# Patient Record
Sex: Female | Born: 1965 | Smoking: Former smoker
Health system: Southern US, Community
[De-identification: ages and names within clinical notes are randomized; demographics above are authoritative.]

## PROBLEM LIST (undated history)

## (undated) DIAGNOSIS — D649 Anemia, unspecified: Secondary | ICD-10-CM

## (undated) DIAGNOSIS — R569 Unspecified convulsions: Secondary | ICD-10-CM

## (undated) DIAGNOSIS — K219 Gastro-esophageal reflux disease without esophagitis: Secondary | ICD-10-CM

## (undated) HISTORY — PX: MYOMECTOMY: SHX85

---

## 2015-05-03 DIAGNOSIS — R87610 Atypical squamous cells of undetermined significance on cytologic smear of cervix (ASC-US): Secondary | ICD-10-CM | POA: Insufficient documentation

## 2015-05-16 ENCOUNTER — Other Ambulatory Visit (HOSPITAL_COMMUNITY): Payer: Self-pay | Admitting: *Deleted

## 2015-05-16 DIAGNOSIS — N6320 Unspecified lump in the left breast, unspecified quadrant: Secondary | ICD-10-CM

## 2015-05-25 ENCOUNTER — Ambulatory Visit (HOSPITAL_COMMUNITY)
Admission: RE | Admit: 2015-05-25 | Discharge: 2015-05-25 | Disposition: A | Discharge status: Home / Self Care | Payer: No Typology Code available for payment source | Source: Ambulatory Visit | Attending: Obstetrics and Gynecology | Admitting: Obstetrics and Gynecology

## 2015-05-25 ENCOUNTER — Ambulatory Visit
Admission: RE | Admit: 2015-05-25 | Discharge: 2015-05-25 | Disposition: A | Discharge status: Home / Self Care | Payer: No Typology Code available for payment source | Source: Ambulatory Visit | Attending: Obstetrics and Gynecology | Admitting: Obstetrics and Gynecology

## 2015-05-25 ENCOUNTER — Encounter (HOSPITAL_COMMUNITY): Payer: Self-pay

## 2015-05-25 VITALS — BP 108/62 | Temp 98.1°F | Ht 60.0 in | Wt 117.0 lb

## 2015-05-25 DIAGNOSIS — N644 Mastodynia: Secondary | ICD-10-CM

## 2015-05-25 DIAGNOSIS — N6320 Unspecified lump in the left breast, unspecified quadrant: Secondary | ICD-10-CM

## 2015-05-25 DIAGNOSIS — Z1239 Encounter for other screening for malignant neoplasm of breast: Secondary | ICD-10-CM

## 2015-05-25 NOTE — Progress Notes (Signed)
CLINIC:  [facility] transport [facility]) Clinic  REASON FOR VISIT: Well-woman exam and diagnostic mammogram.    HISTORY OF PRESENT ILLNESS:  Ms. Costello is a 49 y.o. female who presents to the [facility] today for clinical breast exam. She has questionable history of breast cancer in her mother, but she is unsure.  She reports left breast pain and fullness/"heaviness", that is worse when lying down.  She reports palpating a lump at about 9:30 position in left breast, near nipple. Her last mammogram was done at [facility] in 2011 per her report.  Her last pap smear was in 04/2015 and was negative.  She has no history of abnormal pap smears.   REVIEW OF SYSTEMS:  Denies any right breast pain, nodularity, nipple inversion, or nipple discharge bilaterally.  Left breast per HPI.   ALLERGIES: Allergies  Allergen Reactions  . Penicillins     CURRENT MEDICATIONS:  No current outpatient prescriptions on file prior to encounter.   No current facility-administered medications on file prior to encounter.     PHYSICAL EXAM:  Vitals:  Filed Vitals:   05/25/15 1043  BP: 108/62  Temp: 98.1 F (36.7 C)   General: Well-nourished, well-appearing female in no acute distress.  She is unaccompanied in clinic today.  Rolena Infante, LPN was present during physical exam for this patient.  Breasts: Bilateral breasts exposed and observed with patient standing (arms at side, arms on hips, arms on hips flexed forward, and arms over head).  No gross abnormalities including breast skin puckering or dimpling noted on observation.  Breasts symmetrical without evidence of skin redness, thickening, or peau d'orange appearance. No nipple retraction or nipple discharge noted bilaterally.  No breast nodularity palpated in bilateral breasts.  In area of patient concern (left breast, 9 o'clock, about 2 cm from nipple), there is mild tenderness, but no palpable discrete masses or nodularities.  Normal  fibrocystic breast changes noted in bilateral breasts.   Axillary lymph nodes: No axillary lymphadenopathy bilaterally.   GU: Exam deferred. Pap smear is up-to-date.  ASSESSMENT & PLAN:   1. Breast cancer screening: Ms. Keir has no palpable breast abnormalities on her clinical breast exam today.  She will receive her diagnostic mammogram as scheduled.  She will be contacted by the imaging center for results of the mammogram.  She was given instructions and educational materials regarding breast self-awareness. Ms. Brys is aware of this plan and agrees with it.    Ms. Tally was encouraged to ask questions and all questions were answered to her satisfaction.    Mike Craze, NP [place]  [phone]

## 2015-06-01 ENCOUNTER — Other Ambulatory Visit (HOSPITAL_COMMUNITY): Payer: Self-pay | Admitting: Obstetrics and Gynecology

## 2015-06-01 DIAGNOSIS — N6310 Unspecified lump in the right breast, unspecified quadrant: Secondary | ICD-10-CM

## 2015-06-05 ENCOUNTER — Ambulatory Visit
Admission: RE | Admit: 2015-06-05 | Discharge: 2015-06-05 | Disposition: A | Discharge status: Home / Self Care | Payer: No Typology Code available for payment source | Source: Ambulatory Visit | Attending: Obstetrics and Gynecology | Admitting: Obstetrics and Gynecology

## 2015-06-05 DIAGNOSIS — N6310 Unspecified lump in the right breast, unspecified quadrant: Secondary | ICD-10-CM

## 2015-06-08 ENCOUNTER — Encounter: Payer: Self-pay | Admitting: Medical

## 2015-06-13 ENCOUNTER — Encounter (HOSPITAL_COMMUNITY): Payer: Self-pay | Admitting: *Deleted

## 2015-06-21 ENCOUNTER — Encounter: Payer: Self-pay | Admitting: Obstetrics & Gynecology

## 2015-06-21 ENCOUNTER — Ambulatory Visit: Payer: Self-pay | Admitting: Obstetrics & Gynecology

## 2015-06-21 DIAGNOSIS — N939 Abnormal uterine and vaginal bleeding, unspecified: Secondary | ICD-10-CM

## 2015-06-21 DIAGNOSIS — D219 Benign neoplasm of connective and other soft tissue, unspecified: Secondary | ICD-10-CM | POA: Insufficient documentation

## 2015-06-21 DIAGNOSIS — D259 Leiomyoma of uterus, unspecified: Secondary | ICD-10-CM

## 2015-06-21 DIAGNOSIS — R102 Pelvic and perineal pain: Secondary | ICD-10-CM

## 2015-06-21 DIAGNOSIS — R87610 Atypical squamous cells of undetermined significance on cytologic smear of cervix (ASC-US): Secondary | ICD-10-CM

## 2015-06-21 NOTE — Progress Notes (Signed)
[place] scheduled for October 19th @

## 2015-06-21 NOTE — Progress Notes (Signed)
Patient had ASCUS pap, negative HRHPV on 05/03/15 -> colposcopy not indicated. Ultrasound scheduled for history of chronic pelvic pain, irregular menses, fibroids.  She will follow up to discuss results.  Verita Schneiders, MD, [place] Attending Obstetrician & Gynecologist, [place] for [facility]

## 2015-06-21 NOTE — Patient Instructions (Signed)
Return to clinic for any scheduled appointments or for any gynecologic concerns as needed.   

## 2015-06-28 ENCOUNTER — Ambulatory Visit (INDEPENDENT_AMBULATORY_CARE_PROVIDER_SITE_OTHER): Payer: Self-pay | Admitting: Obstetrics & Gynecology

## 2015-06-28 ENCOUNTER — Encounter: Payer: Self-pay | Admitting: Obstetrics & Gynecology

## 2015-06-28 ENCOUNTER — Ambulatory Visit (HOSPITAL_COMMUNITY)
Admission: RE | Admit: 2015-06-28 | Discharge: 2015-06-28 | Disposition: A | Discharge status: Home / Self Care | Payer: Self-pay | Source: Ambulatory Visit | Attending: Obstetrics & Gynecology | Admitting: Obstetrics & Gynecology

## 2015-06-28 VITALS — BP 133/61 | HR 57 | Resp 16 | Ht 60.0 in | Wt 116.0 lb

## 2015-06-28 DIAGNOSIS — N852 Hypertrophy of uterus: Secondary | ICD-10-CM | POA: Insufficient documentation

## 2015-06-28 DIAGNOSIS — D259 Leiomyoma of uterus, unspecified: Secondary | ICD-10-CM

## 2015-06-28 DIAGNOSIS — N83201 Unspecified ovarian cyst, right side: Secondary | ICD-10-CM | POA: Insufficient documentation

## 2015-06-28 DIAGNOSIS — D252 Subserosal leiomyoma of uterus: Secondary | ICD-10-CM | POA: Insufficient documentation

## 2015-06-28 DIAGNOSIS — N939 Abnormal uterine and vaginal bleeding, unspecified: Secondary | ICD-10-CM

## 2015-06-28 DIAGNOSIS — R102 Pelvic and perineal pain: Secondary | ICD-10-CM | POA: Insufficient documentation

## 2015-06-28 DIAGNOSIS — G8929 Other chronic pain: Secondary | ICD-10-CM | POA: Insufficient documentation

## 2015-06-28 DIAGNOSIS — N926 Irregular menstruation, unspecified: Secondary | ICD-10-CM | POA: Insufficient documentation

## 2015-06-28 MED ORDER — FERROUS SULFATE 325 (65 FE) MG PO TABS: 325.0000 mg | ORAL_TABLET | Freq: Every day | ORAL | Status: DC

## 2015-06-28 MED ORDER — FERROUS SULFATE 325 (65 FE) MG PO TABS: 325.0000 mg | ORAL_TABLET | Freq: Two times a day (BID) | ORAL | Status: DC

## 2015-06-28 MED ORDER — LEUPROLIDE ACETATE (3 MONTH) 11.25 MG IM KIT: 11.2500 mg | PACK | Freq: Once | INTRAMUSCULAR | Status: DC

## 2015-06-28 MED ORDER — MEDROXYPROGESTERONE ACETATE 10 MG PO TABS: 20.0000 mg | ORAL_TABLET | Freq: Every day | ORAL | Status: DC

## 2015-06-28 NOTE — Progress Notes (Signed)
Patient ID: Ellen Hinton, female   DOB: [birth year], 49 y.o.   MRN: [id number]  Chief Complaint  Patient presents with  . Pelvic Pain  DUB  HPI Ellen Hinton is a 49 y.o. female.  [id number] Patient's last menstrual period was 05/16/2015.  US shows multiple fibroids and patient has DUB and cramps, H/O myomectomy>20 years ago HPI  No past medical history on file.  Past Surgical History  Procedure Laterality Date  . Myomectomy      Family History  Problem Relation Age of Onset  . Cancer Mother     lung  . Hypertension Father   . Stroke Father   . Diabetes Maternal Grandmother     Social History Social History  Substance Use Topics  . Smoking status: Current Every Day Smoker    Types: E-cigarettes  . Smokeless tobacco: Never Used  . Alcohol Use: No    Allergies  Allergen Reactions  . Penicillins     Current Outpatient Prescriptions  Medication Sig Dispense Refill  . medroxyPROGESTERone (PROVERA) 10 MG tablet Take 2 tablets (20 mg total) by mouth daily. 30 tablet 2   Current Facility-Administered Medications  Medication Dose Route Frequency Provider Last Rate Last Dose  . ferrous sulfate tablet 325 mg  325 mg Oral BID WC Woodroe Mode, MD      . leuprolide (LUPRON) injection 11.25 mg  11.25 mg Intramuscular Once Woodroe Mode, MD        Review of Systems Review of Systems  Constitutional: Positive for fatigue.  Gastrointestinal: Positive for abdominal pain.  Genitourinary: Positive for vaginal bleeding, menstrual problem and pelvic pain.    Blood pressure 133/61, pulse 57, resp. rate 16, height 5' (1.524 m), weight 116 lb (52.617 kg), last menstrual period 05/16/2015.  Physical Exam Physical Exam  Constitutional: She appears well-developed. No distress.  Skin: Skin is warm and dry. No pallor.  Psychiatric: She has a normal mood and affect. Her behavior is normal.    Data Reviewed   CLINICAL DATA: Chronic pelvic pain, irregular cycles,  fibroids  EXAM: TRANSABDOMINAL AND TRANSVAGINAL ULTRASOUND OF PELVIS  TECHNIQUE: Both transabdominal and transvaginal ultrasound examinations of the pelvis were performed. Transabdominal technique was performed for global imaging of the pelvis including uterus, ovaries, adnexal regions, and pelvic cul-de-sac. It was necessary to proceed with endovaginal exam following the transabdominal exam to visualize the endometrium.  COMPARISON: None  FINDINGS: Uterus  Measurements: 14.3 x 7.7 x 9.9 cm. Numerous uterine fibroids, including:  --4.5 x 4.2 x 4.0 cm subserosal fibroid in the left uterine fundus  --5.0 x 4.0 x 4.6 cm degenerating subserosal/pedunculated fibroid fibroid in the left uterine fundus  --5.9 x 5.5 x 5.7 cm subserosal fibroid in the left uterine body  --4.8 x 5.2 x 6.3 cm subserosal fibroid in the right uterine fundus  Endometrium  Thickness: 5 mm. Poorly visualized/ distorted by multiple uterine fibroids.  Right ovary  Measurements: 6.5 x 2.7 x 3.9 cm. Multiple simple cysts/follicles, measuring up to 4.2 x 2.0 x 2.4 cm.  Left ovary  Not discretely visualized.  Other findings  No free fluid.  IMPRESSION: Enlarged uterus with numerous uterine fibroids measuring up to 6.3 cm, as above.  Simple right ovarian cyst/follicles measuring up to 4.2 cm, likely physiologic.   Electronically Signed  By: Julian Hy M.D.  On: 06/28/2015 14:30       Assessment    Fibroid uterus and DUB and dysmenorrhea     Plan    Provera  20 mg daily Apply for Lupron Depot 11.25 RTC for EMBx Anticipate schedule TAH        ARNOLD,JAMES 06/28/2015, 4:12 PM

## 2015-06-28 NOTE — Progress Notes (Signed)
Patient ID: Ellen Hinton, female   DOB: 06/27/1966, 49 y.o.   MRN: [id number] Pt here for pelvic pain and menorrhagia. Vaginal bleeding over a month. Pain scale today 8/10. Pt c/o fatigue and thinks she could be anemic. She explains her diet is very nutritional and the "tired" feeling she feels is coming from excessive bleeding.  Pt had [place] today and would like results if possible.

## 2015-06-28 NOTE — Patient Instructions (Signed)
Abdominal Hysterectomy Abdominal hysterectomy is a surgical procedure to remove your womb (uterus). Your uterus is the muscular organ that contains a developing baby. This surgery is done for many reasons. You may need an abdominal hysterectomy if you have cancer, growths (tumors), long-term pain, or bleeding. You may also have this procedure if your uterus has slipped down into your vagina (uterine prolapse). Depending on why you need an abdominal hysterectomy, you may also have other reproductive organs removed. These could include the part of your vagina that connects with your uterus (cervix), the organs that make eggs (ovaries), and the tubes that connect the ovaries to the uterus (fallopian tubes). LET [facility] CARE PROVIDER KNOW ABOUT:   Any allergies you have.  All medicines you are taking, including vitamins, herbs, eye drops, creams, and over-the-counter medicines.  Previous problems you or members of your family have had with the use of anesthetics.  Any blood disorders you have.  Previous surgeries you have had.  Medical conditions you have. RISKS AND COMPLICATIONS Generally, this is a safe procedure. However, as with any procedure, problems can occur. Infection is the most common problem after an abdominal hysterectomy. Other possible problems include:  Bleeding.  Formation of blood clots that may break free and travel to your lungs.  Injury to other organs near your uterus.  Nerve injury causing nerve pain.  Decreased interest in sex or pain during sexual intercourse. BEFORE THE PROCEDURE  Abdominal hysterectomy is a major surgical procedure. It can affect the way you feel about yourself. Talk to your health care provider about the physical and emotional changes hysterectomy may cause.  You may need to have blood work and X-rays done before surgery.  Quit smoking if you smoke. Ask your health care provider for help if you are struggling to quit.  Stop taking  medicines that thin your blood as directed by your health care provider.  You may be instructed to take antibiotic medicines or laxatives before surgery.  Do not eat or drink anything for 6-8 hours before surgery.  Take your regular medicines with a small sip of water.  Bathe or shower the night or morning before surgery. PROCEDURE  Abdominal hysterectomy is done in the operating room at the hospital.  In most cases, you will be given a medicine that makes you go to sleep (general anesthetic).  The surgeon will make a cut (incision) through the skin in your lower belly.  The incision may be about 5-7 inches long. It may go side-to-side or up-and-down.  The surgeon will move aside the body tissue that covers your uterus. The surgeon will then carefully take out your uterus along with any of your other reproductive organs that need to be removed.  Bleeding will be controlled with clamps or sutures.  The surgeon will close your incision with sutures or metal clips. AFTER THE PROCEDURE  You will have some pain immediately after the procedure.  You will be given pain medicine in the recovery room.  You will be taken to your hospital room when you have recovered from the anesthesia.  You may need to stay in the hospital for 2-5 days.  You will be given instructions for recovery at home.   This information is not intended to replace advice given to you by your health care provider. Make sure you discuss any questions you have with your health care provider.   Document Released: 08/31/2013 Document Reviewed: 08/31/2013 Elsevier Interactive Patient Education [employer].

## 2015-07-04 ENCOUNTER — Encounter: Payer: Self-pay | Admitting: *Deleted

## 2015-07-19 ENCOUNTER — Other Ambulatory Visit (HOSPITAL_COMMUNITY)
Admission: RE | Admit: 2015-07-19 | Discharge: 2015-07-19 | Disposition: A | Discharge status: Home / Self Care | Payer: Self-pay | Source: Ambulatory Visit | Attending: Obstetrics & Gynecology | Admitting: Obstetrics & Gynecology

## 2015-07-19 ENCOUNTER — Ambulatory Visit: Payer: Self-pay | Admitting: Obstetrics & Gynecology

## 2015-07-19 ENCOUNTER — Encounter: Payer: Self-pay | Admitting: Obstetrics & Gynecology

## 2015-07-19 VITALS — BP 147/69 | HR 67 | Temp 97.8°F | Ht 60.0 in | Wt 120.0 lb

## 2015-07-19 DIAGNOSIS — N946 Dysmenorrhea, unspecified: Secondary | ICD-10-CM | POA: Insufficient documentation

## 2015-07-19 DIAGNOSIS — N840 Polyp of corpus uteri: Secondary | ICD-10-CM | POA: Insufficient documentation

## 2015-07-19 DIAGNOSIS — D259 Leiomyoma of uterus, unspecified: Secondary | ICD-10-CM | POA: Insufficient documentation

## 2015-07-19 DIAGNOSIS — N938 Other specified abnormal uterine and vaginal bleeding: Secondary | ICD-10-CM | POA: Insufficient documentation

## 2015-07-19 LAB — POCT PREGNANCY, URINE: PREG TEST UR: NEGATIVE

## 2015-07-19 NOTE — Progress Notes (Signed)
Patient ID: Ellen Hinton, female   DOB: Jul 05, 1966, 49 y.o.   MRN: [id number] Here for endometrial biopsy for evaluation of her fibroid uterus with plan to schedule TAH  Patient's last menstrual period was 05/16/2015.   Patient given informed consent, signed copy in the chart, time out was performed. Appropriate time out taken. . The patient was placed in the lithotomy position and the cervix brought into view with sterile speculum.  Portio of cervix cleansed x 2 with betadine swabs.  A tenaculum was placed in the anterior lip of the cervix.  The uterus was sounded for depth of 9 cm. A pipelle was introduced to into the uterus, suction created,  and an endometrial sample was obtained. All equipment was removed and accounted for.  The patient tolerated the procedure well.    Patient given post procedure instructions. The patient will be called for results. Applied for lupron depot  Woodroe Mode, MD 07/19/2015

## 2015-07-19 NOTE — Patient Instructions (Signed)

## 2015-08-02 ENCOUNTER — Telehealth: Payer: Self-pay

## 2015-08-02 NOTE — Telephone Encounter (Signed)
Per Dr. Roselie Awkward, pt needs to be informed that her endo bx was benign, f/u on lupron application; surgery has not been scheduled.  Called pt and LM to please return call the call and that our office will be closing today @ 5 and not reopening until Monday @ 0800.

## 2015-08-02 NOTE — Telephone Encounter (Signed)
Pt returned call and I informed her of normal endo bx results.  Contacted [employer] concerning pt's Lupron application and was informed that the date beside provider signature was missing.  Once we fill in the date the pt would be approved for the Lupron.  I re-faxed Lupron application with requested correction.  Informed pt that she should receive a letter from [employer] indicating approval and also we call her when we receive the Lupron medication in the office.  Pt stated understanding with no further questions.

## 2015-08-10 ENCOUNTER — Ambulatory Visit (INDEPENDENT_AMBULATORY_CARE_PROVIDER_SITE_OTHER): Payer: Self-pay | Admitting: *Deleted

## 2015-08-10 VITALS — BP 129/82 | HR 61

## 2015-08-10 DIAGNOSIS — N939 Abnormal uterine and vaginal bleeding, unspecified: Secondary | ICD-10-CM

## 2015-08-10 DIAGNOSIS — D259 Leiomyoma of uterus, unspecified: Secondary | ICD-10-CM

## 2015-08-10 MED ORDER — LEUPROLIDE ACETATE (3 MONTH) 11.25 MG IM KIT
11.2500 mg | PACK | Freq: Once | INTRAMUSCULAR | Status: AC
  Administered 2015-08-10: 11.25 mg via INTRAMUSCULAR

## 2015-08-10 NOTE — Patient Instructions (Signed)
Leuprolide depot injection What is this medicine? LEUPROLIDE (loo PROE lide) is a man-made protein that acts like a natural hormone in the body. It decreases testosterone in men and decreases estrogen in women. In men, this medicine is used to treat advanced prostate cancer. In women, some forms of this medicine may be used to treat endometriosis, uterine fibroids, or other female hormone-related problems. This medicine may be used for other purposes; ask your health care provider or pharmacist if you have questions. What should I tell my health care provider before I take this medicine? They need to know if you have any of these conditions: -diabetes -heart disease or previous heart attack -high blood pressure -high cholesterol -osteoporosis -pain or difficulty passing urine -spinal cord metastasis -stroke -tobacco smoker -unusual vaginal bleeding (women) -an unusual or allergic reaction to leuprolide, benzyl alcohol, other medicines, foods, dyes, or preservatives -pregnant or trying to get pregnant -breast-feeding How should I use this medicine? This medicine is for injection into a muscle or for injection under the skin. It is given by a health care professional in a hospital or clinic setting. The specific product will determine how it will be given to you. Make sure you understand which product you receive and how often you will receive it. Talk to your pediatrician regarding the use of this medicine in children. Special care may be needed. Overdosage: If you think you have taken too much of this medicine contact a poison control center or emergency room at once. NOTE: This medicine is only for you. Do not share this medicine with others. What if I miss a dose? It is important not to miss a dose. Call your doctor or health care professional if you are unable to keep an appointment. Depot injections: Depot injections are given either once-monthly, every 12 weeks, every 16 weeks, or  every 24 weeks depending on the product you are prescribed. The product you are prescribed will be based on if you are female or female, and your condition. Make sure you understand your product and dosing. What may interact with this medicine? Do not take this medicine with any of the following medications: -chasteberry This medicine may also interact with the following medications: -herbal or dietary supplements, like black cohosh or DHEA -female hormones, like estrogens or progestins and birth control pills, patches, rings, or injections -female hormones, like testosterone This list may not describe all possible interactions. Give your health care provider a list of all the medicines, herbs, non-prescription drugs, or dietary supplements you use. Also tell them if you smoke, drink alcohol, or use illegal drugs. Some items may interact with your medicine. What should I watch for while using this medicine? Visit your doctor or health care professional for regular checks on your progress. During the first weeks of treatment, your symptoms may get worse, but then will improve as you continue your treatment. You may get hot flashes, increased bone pain, increased difficulty passing urine, or an aggravation of nerve symptoms. Discuss these effects with your doctor or health care professional, some of them may improve with continued use of this medicine. Female patients may experience a menstrual cycle or spotting during the first months of therapy with this medicine. If this continues, contact your doctor or health care professional. What side effects may I notice from receiving this medicine? Side effects that you should report to your doctor or health care professional as soon as possible: -allergic reactions like skin rash, itching or hives, swelling of the   face, lips, or tongue -breathing problems -chest pain -depression or memory disorders -pain in your legs or groin -pain at site where injected or  implanted -severe headache -swelling of the feet and legs -visual changes -vomiting Side effects that usually do not require medical attention (report to your doctor or health care professional if they continue or are bothersome): -breast swelling or tenderness -decrease in sex drive or performance -diarrhea -hot flashes -loss of appetite -muscle, joint, or bone pains -nausea -redness or irritation at site where injected or implanted -skin problems or acne This list may not describe all possible side effects. Call your doctor for medical advice about side effects. You may report side effects to FDA at 1-800-FDA-1088. Where should I keep my medicine? This drug is given in a hospital or clinic and will not be stored at home. NOTE: This sheet is a summary. It may not cover all possible information. If you have questions about this medicine, talk to your doctor, pharmacist, or health care provider.    2016, Elsevier/Gold Standard. (2014-05-20 14:16:23)  

## 2015-08-10 NOTE — Progress Notes (Signed)
Here for Lupron. Reports bleeding less, more like a regular period.  Given information on Lupron. States has not heard about financial application. Advised her to call to check on her status and once she hears if she is approved or not to call office so she can get surgery scheduled and make appointment to see Dr. Roselie Awkward. She voices understanding.

## 2015-08-14 ENCOUNTER — Telehealth: Payer: Self-pay | Admitting: General Practice

## 2015-08-14 NOTE — Telephone Encounter (Signed)
Patient called and left message stating she received the letter she needed for surgery and would like call back to schedule an appt. Called Dr Roselie Awkward who states he will message surgery scheduler to have surgery set up. Called patient and discussed that she will be receiving a letter in the mail in the next couple of weeks with her surgery date & closer to surgery she will come in for a pre op visit with a nurse from anesthesia. Patient verbalized understanding and asked when date of surgery would be. Told patient probably not until end of January early February, but that depends on how busy Dr Jordan Hawks surgery schedule is. Patient verbalized understanding & asked if she needed to bring the letter in. Told patient she can bring that to her pre op visit. Patient verbalized understanding & had no other questions

## 2015-08-24 ENCOUNTER — Telehealth: Payer: Self-pay | Admitting: General Practice

## 2015-08-24 NOTE — Telephone Encounter (Signed)
Patient called and left message stating she is still waiting on her surgery date. Patient states it has been almost 2 weeks. Called patient back stating I am returning her call. Told patient I do not see an appt yet for surgery but she should be receiving a letter in the mail with that date. Told patient it hasn't quite been 2 weeks yet. Patient verbalized understanding and states yesterday she was in severe pain all day to the point where she couldn't move. Patient states she is feeling better today but wants to know if she should go to the ER if that happens again. Recommended if she has pain to try ibuprofen 800 every 6 hours as needed. Told patient if she takes that and is still having severe pain she should go to [facility]. Patient verbalized understanding & had no other questions

## 2015-08-25 ENCOUNTER — Encounter (HOSPITAL_COMMUNITY): Payer: Self-pay | Admitting: *Deleted

## 2015-09-18 ENCOUNTER — Encounter (HOSPITAL_COMMUNITY)
Admission: RE | Admit: 2015-09-18 | Discharge: 2015-09-18 | Disposition: A | Discharge status: Home / Self Care | Payer: Self-pay | Source: Ambulatory Visit | Attending: Obstetrics & Gynecology | Admitting: Obstetrics & Gynecology

## 2015-09-18 ENCOUNTER — Encounter (HOSPITAL_COMMUNITY): Payer: Self-pay

## 2015-09-18 ENCOUNTER — Other Ambulatory Visit: Payer: Self-pay | Admitting: Obstetrics & Gynecology

## 2015-09-18 DIAGNOSIS — N938 Other specified abnormal uterine and vaginal bleeding: Secondary | ICD-10-CM | POA: Insufficient documentation

## 2015-09-18 DIAGNOSIS — D259 Leiomyoma of uterus, unspecified: Secondary | ICD-10-CM | POA: Insufficient documentation

## 2015-09-18 DIAGNOSIS — N920 Excessive and frequent menstruation with regular cycle: Secondary | ICD-10-CM | POA: Insufficient documentation

## 2015-09-18 DIAGNOSIS — Z01812 Encounter for preprocedural laboratory examination: Secondary | ICD-10-CM | POA: Insufficient documentation

## 2015-09-18 HISTORY — DX: Gastro-esophageal reflux disease without esophagitis: K21.9

## 2015-09-18 HISTORY — DX: Unspecified convulsions: R56.9

## 2015-09-18 HISTORY — DX: Anemia, unspecified: D64.9

## 2015-09-18 LAB — CBC
HEMATOCRIT: 39.1 % (ref 36.0–46.0)
HEMOGLOBIN: 12.6 g/dL (ref 12.0–15.0)
MCH: 29.5 pg (ref 26.0–34.0)
MCHC: 32.2 g/dL (ref 30.0–36.0)
MCV: 91.6 fL (ref 78.0–100.0)
Platelets: 357 10*3/uL (ref 150–400)
RBC: 4.27 MIL/uL (ref 3.87–5.11)
RDW: 19.4 % — ABNORMAL HIGH (ref 11.5–15.5)
WBC: 5.7 10*3/uL (ref 4.0–10.5)

## 2015-09-18 NOTE — Patient Instructions (Signed)
Your procedure is scheduled on: September 26, 2015   Enter through the Main Entrance of [facility] at: 12:30 pm   Pick up the phone at the desk and dial [phone].  Call this number if you have problems the morning of surgery: [phone].  Remember: Do NOT eat food: after midnight on Monday  Do NOT drink clear liquids after: 10:00 am day of surgery  Take these medicines the morning of surgery with a SIP OF WATER: none   Do NOT wear jewelry (body piercing), metal hair clips/bobby pins, make-up, or nail polish. Do NOT wear lotions, powders, or perfumes.  You may wear deoderant. Do NOT shave for 48 hours prior to surgery. Do NOT bring valuables to the hospital. Contacts, dentures, or bridgework may not be worn into surgery. Leave suitcase in car.  After surgery it may be brought to your room.  For patients admitted to the hospital, checkout time is 11:00 AM the day of discharge.

## 2015-09-26 ENCOUNTER — Encounter (HOSPITAL_COMMUNITY)
Admission: RE | Disposition: A | Discharge status: Home / Self Care | Payer: Self-pay | Source: Ambulatory Visit | Attending: Obstetrics & Gynecology

## 2015-09-26 ENCOUNTER — Inpatient Hospital Stay (HOSPITAL_COMMUNITY): Payer: Self-pay | Admitting: Anesthesiology

## 2015-09-26 ENCOUNTER — Inpatient Hospital Stay (HOSPITAL_COMMUNITY)
Admission: RE | Admit: 2015-09-26 | Discharge: 2015-09-28 | DRG: 743 | Disposition: A | Discharge status: Home / Self Care | Payer: Self-pay | Source: Ambulatory Visit | Attending: Obstetrics & Gynecology | Admitting: Obstetrics & Gynecology

## 2015-09-26 ENCOUNTER — Encounter (HOSPITAL_COMMUNITY): Payer: Self-pay | Admitting: *Deleted

## 2015-09-26 DIAGNOSIS — D259 Leiomyoma of uterus, unspecified: Secondary | ICD-10-CM

## 2015-09-26 DIAGNOSIS — D219 Benign neoplasm of connective and other soft tissue, unspecified: Secondary | ICD-10-CM | POA: Diagnosis present

## 2015-09-26 DIAGNOSIS — N946 Dysmenorrhea, unspecified: Secondary | ICD-10-CM | POA: Diagnosis present

## 2015-09-26 DIAGNOSIS — N939 Abnormal uterine and vaginal bleeding, unspecified: Secondary | ICD-10-CM

## 2015-09-26 DIAGNOSIS — Z90710 Acquired absence of both cervix and uterus: Secondary | ICD-10-CM | POA: Diagnosis present

## 2015-09-26 DIAGNOSIS — R102 Pelvic and perineal pain: Secondary | ICD-10-CM | POA: Diagnosis present

## 2015-09-26 DIAGNOSIS — N83209 Unspecified ovarian cyst, unspecified side: Secondary | ICD-10-CM | POA: Diagnosis present

## 2015-09-26 DIAGNOSIS — D252 Subserosal leiomyoma of uterus: Secondary | ICD-10-CM | POA: Diagnosis present

## 2015-09-26 DIAGNOSIS — N938 Other specified abnormal uterine and vaginal bleeding: Principal | ICD-10-CM | POA: Diagnosis present

## 2015-09-26 DIAGNOSIS — F17200 Nicotine dependence, unspecified, uncomplicated: Secondary | ICD-10-CM | POA: Diagnosis present

## 2015-09-26 HISTORY — PX: ABDOMINAL HYSTERECTOMY: SHX81

## 2015-09-26 LAB — PREGNANCY, URINE: PREG TEST UR: NEGATIVE

## 2015-09-26 SURGERY — HYSTERECTOMY, ABDOMINAL
Anesthesia: General | Site: Abdomen | Laterality: Bilateral

## 2015-09-26 MED ORDER — MIDAZOLAM HCL 2 MG/2ML IJ SOLN
INTRAMUSCULAR | Status: AC
  Filled 2015-09-26: qty 2

## 2015-09-26 MED ORDER — LACTATED RINGERS IV SOLN
INTRAVENOUS | Status: DC
Start: 2015-09-26 — End: 2015-09-26
  Administered 2015-09-26: 19:00:00 via INTRAVENOUS

## 2015-09-26 MED ORDER — DEXAMETHASONE SODIUM PHOSPHATE 10 MG/ML IJ SOLN
INTRAMUSCULAR | Status: DC | PRN
  Administered 2015-09-26: 10 mg via INTRAVENOUS

## 2015-09-26 MED ORDER — SCOPOLAMINE 1 MG/3DAYS TD PT72
MEDICATED_PATCH | TRANSDERMAL | Status: AC
  Administered 2015-09-26: 1.5 mg via TRANSDERMAL
  Filled 2015-09-26: qty 1

## 2015-09-26 MED ORDER — ONDANSETRON HCL 4 MG PO TABS: 4.0000 mg | ORAL_TABLET | Freq: Four times a day (QID) | ORAL | Status: DC | PRN

## 2015-09-26 MED ORDER — MIDAZOLAM HCL 2 MG/2ML IJ SOLN
INTRAMUSCULAR | Status: DC | PRN
  Administered 2015-09-26: 2 mg via INTRAVENOUS

## 2015-09-26 MED ORDER — LIDOCAINE HCL (CARDIAC) 20 MG/ML IV SOLN
INTRAVENOUS | Status: DC | PRN
  Administered 2015-09-26 (×2): 50 mg via INTRAVENOUS

## 2015-09-26 MED ORDER — MEPERIDINE HCL 25 MG/ML IJ SOLN: 6.2500 mg | INTRAMUSCULAR | Status: DC | PRN

## 2015-09-26 MED ORDER — NALOXONE HCL 0.4 MG/ML IJ SOLN: 0.4000 mg | INTRAMUSCULAR | Status: DC | PRN

## 2015-09-26 MED ORDER — DEXAMETHASONE SODIUM PHOSPHATE 4 MG/ML IJ SOLN
INTRAMUSCULAR | Status: AC
  Filled 2015-09-26: qty 1

## 2015-09-26 MED ORDER — OXYCODONE-ACETAMINOPHEN 5-325 MG PO TABS
1.0000 | ORAL_TABLET | ORAL | Status: DC | PRN
  Administered 2015-09-27 – 2015-09-28 (×4): 1 via ORAL
  Administered 2015-09-28: 2 via ORAL
  Filled 2015-09-26 (×4): qty 1
  Filled 2015-09-26: qty 2
  Filled 2015-09-26: qty 1

## 2015-09-26 MED ORDER — NEOSTIGMINE METHYLSULFATE 10 MG/10ML IV SOLN
INTRAVENOUS | Status: DC | PRN
  Administered 2015-09-26 (×2): 1 mg via INTRAVENOUS
  Administered 2015-09-26: 2 mg via INTRAVENOUS
  Administered 2015-09-26: 1 mg via INTRAVENOUS

## 2015-09-26 MED ORDER — GENTAMICIN SULFATE 40 MG/ML IJ SOLN
Freq: Once | INTRAVENOUS | Status: AC
  Administered 2015-09-26: 100 mL via INTRAVENOUS
  Filled 2015-09-26: qty 6.25

## 2015-09-26 MED ORDER — ROCURONIUM BROMIDE 100 MG/10ML IV SOLN
INTRAVENOUS | Status: DC | PRN
  Administered 2015-09-26: 35 mg via INTRAVENOUS
  Administered 2015-09-26: 5 mg via INTRAVENOUS

## 2015-09-26 MED ORDER — CEFAZOLIN SODIUM-DEXTROSE 2-3 GM-% IV SOLR: 2.0000 g | INTRAVENOUS | Status: DC

## 2015-09-26 MED ORDER — DIPHENHYDRAMINE HCL 50 MG/ML IJ SOLN: 12.5000 mg | Freq: Four times a day (QID) | INTRAMUSCULAR | Status: DC | PRN

## 2015-09-26 MED ORDER — LACTATED RINGERS IV SOLN
INTRAVENOUS | Status: DC
  Administered 2015-09-26 (×3): via INTRAVENOUS

## 2015-09-26 MED ORDER — KETOROLAC TROMETHAMINE 30 MG/ML IJ SOLN
INTRAMUSCULAR | Status: AC
  Filled 2015-09-26: qty 1

## 2015-09-26 MED ORDER — DIPHENHYDRAMINE HCL 12.5 MG/5ML PO ELIX: 12.5000 mg | ORAL_SOLUTION | Freq: Four times a day (QID) | ORAL | Status: DC | PRN

## 2015-09-26 MED ORDER — BUPIVACAINE HCL (PF) 0.5 % IJ SOLN
INTRAMUSCULAR | Status: AC
  Filled 2015-09-26: qty 30

## 2015-09-26 MED ORDER — CEFAZOLIN SODIUM-DEXTROSE 2-3 GM-% IV SOLR
INTRAVENOUS | Status: AC
  Filled 2015-09-26: qty 50

## 2015-09-26 MED ORDER — ONDANSETRON HCL 4 MG/2ML IJ SOLN
INTRAMUSCULAR | Status: AC
  Filled 2015-09-26: qty 2

## 2015-09-26 MED ORDER — SCOPOLAMINE 1 MG/3DAYS TD PT72
MEDICATED_PATCH | TRANSDERMAL | Status: AC
  Filled 2015-09-26: qty 1

## 2015-09-26 MED ORDER — KETOROLAC TROMETHAMINE 30 MG/ML IJ SOLN: 30.0000 mg | Freq: Four times a day (QID) | INTRAMUSCULAR | Status: DC

## 2015-09-26 MED ORDER — HYDROMORPHONE 1 MG/ML IV SOLN
INTRAVENOUS | Status: DC
  Administered 2015-09-26: 19:00:00 via INTRAVENOUS
  Administered 2015-09-26: 1.2 mg via INTRAVENOUS
  Administered 2015-09-27: 1 mg via INTRAVENOUS
  Administered 2015-09-27: 1.6 mg via INTRAVENOUS
  Administered 2015-09-27: 1.2 mg via INTRAVENOUS
  Filled 2015-09-26: qty 25

## 2015-09-26 MED ORDER — GLYCOPYRROLATE 0.2 MG/ML IJ SOLN
INTRAMUSCULAR | Status: AC
  Filled 2015-09-26: qty 3

## 2015-09-26 MED ORDER — CLINDAMYCIN PHOSPHATE 900 MG/50ML IV SOLN: 900.0000 mg | Freq: Once | INTRAVENOUS | Status: DC

## 2015-09-26 MED ORDER — FENTANYL CITRATE (PF) 100 MCG/2ML IJ SOLN
INTRAMUSCULAR | Status: DC | PRN
  Administered 2015-09-26 (×2): 100 ug via INTRAVENOUS
  Administered 2015-09-26: 50 ug via INTRAVENOUS

## 2015-09-26 MED ORDER — GLYCOPYRROLATE 0.2 MG/ML IJ SOLN
INTRAMUSCULAR | Status: DC | PRN
  Administered 2015-09-26: 0.2 mg via INTRAVENOUS
  Administered 2015-09-26: .4 mg via INTRAVENOUS
  Administered 2015-09-26 (×2): 0.2 mg via INTRAVENOUS

## 2015-09-26 MED ORDER — ROCURONIUM BROMIDE 100 MG/10ML IV SOLN
INTRAVENOUS | Status: AC
  Filled 2015-09-26: qty 1

## 2015-09-26 MED ORDER — NEOSTIGMINE METHYLSULFATE 10 MG/10ML IV SOLN
INTRAVENOUS | Status: AC
  Filled 2015-09-26: qty 1

## 2015-09-26 MED ORDER — ONDANSETRON HCL 4 MG/2ML IJ SOLN: 4.0000 mg | Freq: Four times a day (QID) | INTRAMUSCULAR | Status: DC | PRN

## 2015-09-26 MED ORDER — ONDANSETRON HCL 4 MG/2ML IJ SOLN
INTRAMUSCULAR | Status: DC | PRN
  Administered 2015-09-26: 4 mg via INTRAVENOUS

## 2015-09-26 MED ORDER — PROPOFOL 10 MG/ML IV BOLUS
INTRAVENOUS | Status: AC
  Filled 2015-09-26: qty 20

## 2015-09-26 MED ORDER — FENTANYL CITRATE (PF) 100 MCG/2ML IJ SOLN
INTRAMUSCULAR | Status: AC
  Administered 2015-09-26: 25 ug via INTRAVENOUS
  Filled 2015-09-26: qty 2

## 2015-09-26 MED ORDER — LACTATED RINGERS IV SOLN
INTRAVENOUS | Status: DC
  Administered 2015-09-26: 19:00:00 via INTRAVENOUS

## 2015-09-26 MED ORDER — FENTANYL CITRATE (PF) 100 MCG/2ML IJ SOLN
25.0000 ug | INTRAMUSCULAR | Status: DC | PRN
  Administered 2015-09-26 (×2): 25 ug via INTRAVENOUS

## 2015-09-26 MED ORDER — FENTANYL CITRATE (PF) 250 MCG/5ML IJ SOLN
INTRAMUSCULAR | Status: AC
  Filled 2015-09-26: qty 5

## 2015-09-26 MED ORDER — PROMETHAZINE HCL 25 MG/ML IJ SOLN: 6.2500 mg | INTRAMUSCULAR | Status: DC | PRN

## 2015-09-26 MED ORDER — BUPIVACAINE HCL (PF) 0.5 % IJ SOLN
INTRAMUSCULAR | Status: DC | PRN
  Administered 2015-09-26: 30 mL

## 2015-09-26 MED ORDER — LACTATED RINGERS IV SOLN: INTRAVENOUS | Status: DC

## 2015-09-26 MED ORDER — SODIUM CHLORIDE 0.9 % IJ SOLN: 9.0000 mL | INTRAMUSCULAR | Status: DC | PRN

## 2015-09-26 MED ORDER — ONDANSETRON HCL 4 MG/2ML IJ SOLN
4.0000 mg | Freq: Four times a day (QID) | INTRAMUSCULAR | Status: DC | PRN
Start: 2015-09-26 — End: 2015-09-28

## 2015-09-26 MED ORDER — LIDOCAINE HCL (CARDIAC) 20 MG/ML IV SOLN
INTRAVENOUS | Status: AC
  Filled 2015-09-26: qty 5

## 2015-09-26 MED ORDER — KETOROLAC TROMETHAMINE 30 MG/ML IJ SOLN
30.0000 mg | Freq: Four times a day (QID) | INTRAMUSCULAR | Status: DC
  Administered 2015-09-26 – 2015-09-27 (×3): 30 mg via INTRAVENOUS
  Filled 2015-09-26 (×3): qty 1

## 2015-09-26 MED ORDER — SCOPOLAMINE 1 MG/3DAYS TD PT72
1.0000 | MEDICATED_PATCH | Freq: Once | TRANSDERMAL | Status: AC
  Administered 2015-09-26: 1.5 mg via TRANSDERMAL
  Administered 2015-09-26: 1 via TRANSDERMAL

## 2015-09-26 MED ORDER — PROPOFOL 10 MG/ML IV BOLUS
INTRAVENOUS | Status: DC | PRN
  Administered 2015-09-26: 150 mg via INTRAVENOUS

## 2015-09-26 SURGICAL SUPPLY — 31 items
CANISTER SUCT 3000ML (MISCELLANEOUS) ×2 IMPLANT
CLOTH BEACON ORANGE TIMEOUT ST (SAFETY) ×2 IMPLANT
CONT PATH 16OZ SNAP LID 3702 (MISCELLANEOUS) ×2 IMPLANT
DRAPE WARM FLUID 44X44 (DRAPE) ×8 IMPLANT
DRSG OPSITE POSTOP 4X10 (GAUZE/BANDAGES/DRESSINGS) ×2 IMPLANT
DURAPREP 26ML APPLICATOR (WOUND CARE) ×2 IMPLANT
GAUZE SPONGE 4X4 16PLY XRAY LF (GAUZE/BANDAGES/DRESSINGS) ×2 IMPLANT
GLOVE BIO SURGEON STRL SZ 6.5 (GLOVE) ×2 IMPLANT
GLOVE BIOGEL PI IND STRL 7.0 (GLOVE) ×4 IMPLANT
GLOVE BIOGEL PI INDICATOR 7.0 (GLOVE) ×4
GOWN STRL REUS W/TWL LRG LVL3 (GOWN DISPOSABLE) ×6 IMPLANT
NEEDLE HYPO 22GX1.5 SAFETY (NEEDLE) ×2 IMPLANT
NS IRRIG 1000ML POUR BTL (IV SOLUTION) ×4 IMPLANT
PACK ABDOMINAL GYN (CUSTOM PROCEDURE TRAY) ×2 IMPLANT
PAD OB MATERNITY 4.3X12.25 (PERSONAL CARE ITEMS) ×2 IMPLANT
PENCIL SMOKE EVAC W/HOLSTER (ELECTROSURGICAL) ×2 IMPLANT
SPONGE LAP 18X18 X RAY DECT (DISPOSABLE) ×4 IMPLANT
STAPLER VISISTAT 35W (STAPLE) IMPLANT
SUT VIC AB 0 CT1 18XCR BRD8 (SUTURE) ×4 IMPLANT
SUT VIC AB 0 CT1 27 (SUTURE) ×1
SUT VIC AB 0 CT1 27XBRD ANBCTR (SUTURE) ×1 IMPLANT
SUT VIC AB 0 CT1 36 (SUTURE) ×4 IMPLANT
SUT VIC AB 0 CT1 8-18 (SUTURE) ×4
SUT VIC AB 2-0 CT1 27 (SUTURE) ×1
SUT VIC AB 2-0 CT1 TAPERPNT 27 (SUTURE) ×1 IMPLANT
SUT VIC AB 4-0 PS2 27 (SUTURE) ×2 IMPLANT
SUT VICRYL 0 TIES 12 18 (SUTURE) ×2 IMPLANT
SYR CONTROL 10ML LL (SYRINGE) ×2 IMPLANT
TOWEL OR 17X24 6PK STRL BLUE (TOWEL DISPOSABLE) ×4 IMPLANT
TRAY FOLEY CATH SILVER 14FR (SET/KITS/TRAYS/PACK) ×2 IMPLANT
WATER STERILE IRR 1000ML POUR (IV SOLUTION) ×2 IMPLANT

## 2015-09-26 NOTE — Anesthesia Procedure Notes (Signed)
Procedure Name: Intubation Date/Time: 09/26/2015 3:25 PM Performed by: Brock Ra Pre-anesthesia Checklist: Patient identified, Emergency Drugs available, Suction available, Patient being monitored and Timeout performed Patient Re-evaluated:Patient Re-evaluated prior to inductionOxygen Delivery Method: Circle system utilized Preoxygenation: Pre-oxygenation with 100% oxygen Intubation Type: IV induction Ventilation: Mask ventilation without difficulty Laryngoscope Size: Mac and 3 Grade View: Grade II Tube type: Oral Tube size: 7.0 mm Number of attempts: 1 Airway Equipment and Method: Stylet Placement Confirmation: ETT inserted through vocal cords under direct vision,  positive ETCO2 and breath sounds checked- equal and bilateral Secured at: 20 cm Tube secured with: Tape Dental Injury: Teeth and Oropharynx as per pre-operative assessment

## 2015-09-26 NOTE — Op Note (Signed)
Donneta Romberg PROCEDURE DATE: 09/26/2015  PREOPERATIVE DIAGNOSES:  Symptomatic fibroids, abnormal uterine bleeding, pelvic pain POSTOPERATIVE DIAGNOSES:  The same SURGEON:   Woodroe Mode, MD ASSISTANT: Clovia Cuff, M.D. OPERATION:  Total abdominal hysterectomy, Bilateral Salpingectomy  ANESTHESIA:  General endotracheal.  INDICATIONS: The patient is a 50 y.o. G1P0010 with the aforementioned diagnoses who desires definitive surgical management. On the preoperative visit, the risks, benefits, indications, and alternatives of the procedure were reviewed with the patient.  On the day of surgery, the risks of surgery were again discussed with the patient including but not limited to: bleeding which may require transfusion or reoperation; infection which may require antibiotics; injury to bowel, bladder, ureters or other surrounding organs; need for additional procedures; thromboembolic phenomenon, incisional problems and other postoperative/anesthesia complications. Written informed consent was obtained.    OPERATIVE FINDINGS: A 16 week size uterus with pedunculated fibroids with normal tubes and ovaries bilaterally.  ESTIMATED BLOOD LOSS: 200 ml FLUIDS:  1500 ml of Lactated Ringers URINE OUTPUT:  100 ml of clear yellow urine. SPECIMENS:  Uterus,cervix,  bilateral fallopian tubes sent to pathology COMPLICATIONS:  None immediate.   DESCRIPTION OF PROCEDURE:  The patient received intravenous antibiotics and had sequential compression devices applied to her lower extremities while in the preoperative area.   She was taken to the operating room and placed under general anesthesia without difficulty.The abdomen and perineum were prepped and draped in a sterile manner, and she was placed in a dorsal supine position.  A Foley catheter was inserted into the bladder and attached to constant drainage. After an adequate timeout was performed, a Pfannensteil skin incision was made. This incision was taken down  to the fascia using electrocautery with care given to maintain good hemostasis. The fascia was incised in the midline and the fascial incision was then extended bilaterally using electrocautery without difficulty. The fascia was then dissected off the underlying rectus muscles using blunt and sharp dissection. The rectus muscles were split bluntly in the midline and the peritoneum entered sharply without complication. This peritoneal incision was then extended superiorly and inferiorly with care given to prevent bowel or bladder injury. Attention was then turned to the pelvis. The uterus was irregularly enlarge with subserosal and pedunculated fibroids. The uterus was elevated from the pelvis.The bowel was packed away with moist laparotomy sponges.  The round ligaments on each side were clamped, suture ligated with 0 Vicryl, and transected with electrocautery allowing entry into the broad ligament. Of note, all sutures used in this procedure are 0 Vicryl unless otherwise noted. The anterior and posterior leaves of the broad ligament were separated, and the ureters were inspected to be safely away from the area of dissection bilaterally.  Adnexae were clamped on the patient's right side, cut, and doubly suture ligated. This procedure was repeated in an identical fashion on the left site allowing for both adnexa to remain in place.  Kelly clamps were placed on the mesosalpinx of the right fallopian tube, and the fallopian tube was excised.  The pedicle was then secured with a suture tie.  A similar process was carried out on the left side, allowing for bilateral salpingectomy.    .  A bladder flap was then created.  The bladder was then bluntly dissected off the lower uterine segment and cervix with good hemostasis noted. The uterine arteries were then skeletonized bilaterally and then clamped, cut, and doubly suture ligated with care given to prevent ureteral injury.  The uterosacral ligaments were then clamped,  cut, and ligated bilaterally.  Finally, the cardinal ligaments were clamped, cut, and ligated bilaterally.  Acutely curved clamps were placed across the vagina just under the cervix, and the specimen was amputated and sent to pathology. The vaginal cuff angles were closed with Heaney stiches with care given to incorporate the uterosacral-cardinal ligament pedicles on both sides. The middle of the vaginal cuff was closed with a series of interrupted figure-of-eight sutures with care given to incorporate the anterior pubocervical fascia and the posterior rectovaginal fascia.   The pelvis was irrigated and hemostasis was reconfirmed at all pedicles and along the pelvic sidewall.  The ureters were inspected and noted to be peristalsing bilaterally.  All laparotomy sponges and instruments were removed from the abdomen. The peritoneum was closed with a running stitch, and the fascia was also closed in a running fashion.  The skin was closed with a 4-0 Vicryl subcuticular stitch. Sponge, lap, needle, and instrument counts were correct times two. The patient was taken to the recovery area awake, extubated and in stable condition.  Woodroe Mode, MD Attending [place], [facility]

## 2015-09-26 NOTE — H&P (Signed)
Chief Complaint  Patient presents with  . Pelvic Pain  DUB, fibroid uterus  HPI Ellen Hinton is a 50 y.o. female. G1P0010 Patient's last menstrual period was 05/16/2015. US shows multiple fibroids and patient has DUB and cramps, H/O myomectomy>20 years ago HPI Scheduled for hysterectomy and BS  No past medical history on file.  Past Surgical History  Procedure Laterality Date  . Myomectomy      Family History  Problem Relation Age of Onset  . Cancer Mother     lung  . Hypertension Father   . Stroke Father   . Diabetes Maternal Grandmother     Social History Social History  Substance Use Topics  . Smoking status: Current Every Day Smoker    Types: E-cigarettes  . Smokeless tobacco: Never Used  . Alcohol Use: No    Allergies  Allergen Reactions  . Penicillins     Current Outpatient Prescriptions  Medication Sig Dispense Refill  . medroxyPROGESTERone (PROVERA) 10 MG tablet Take 2 tablets (20 mg total) by mouth daily. 30 tablet 2   Current Facility-Administered Medications  Medication Dose Route Frequency Provider Last Rate Last Dose  . ferrous sulfate tablet 325 mg 325 mg Oral BID WC Woodroe Mode, MD    . leuprolide (LUPRON) injection 11.25 mg 11.25 mg Intramuscular Once Woodroe Mode, MD      Review of Systems Review of Systems  Constitutional: Positive for fatigue.  Gastrointestinal: Positive for abdominal pain.  Genitourinary: Positive for vaginal bleeding, menstrual problem and pelvic pain.    Blood pressure 128/80, pulse 62, temperature 98.4 F (36.9 C), temperature source Oral, resp. rate 18, SpO2 100 %.   Physical Exam Physical Exam  Constitutional: She appears well-developed. No distress.  Chest: normal effort no respiratory distress Abdomen: soft with firm mass 14-16 weeks size c/w fibroid uterus Skin: Skin is warm and dry. No  pallor.  Psychiatric: She has a normal mood and affect. Her behavior is normal.    Data Reviewed   CLINICAL DATA: Chronic pelvic pain, irregular cycles, fibroids  EXAM: TRANSABDOMINAL AND TRANSVAGINAL ULTRASOUND OF PELVIS  TECHNIQUE: Both transabdominal and transvaginal ultrasound examinations of the pelvis were performed. Transabdominal technique was performed for global imaging of the pelvis including uterus, ovaries, adnexal regions, and pelvic cul-de-sac. It was necessary to proceed with endovaginal exam following the transabdominal exam to visualize the endometrium.  COMPARISON: None  FINDINGS: Uterus  Measurements: 14.3 x 7.7 x 9.9 cm. Numerous uterine fibroids, including:  --4.5 x 4.2 x 4.0 cm subserosal fibroid in the left uterine fundus  --5.0 x 4.0 x 4.6 cm degenerating subserosal/pedunculated fibroid fibroid in the left uterine fundus  --5.9 x 5.5 x 5.7 cm subserosal fibroid in the left uterine body  --4.8 x 5.2 x 6.3 cm subserosal fibroid in the right uterine fundus  Endometrium  Thickness: 5 mm. Poorly visualized/ distorted by multiple uterine fibroids.  Right ovary  Measurements: 6.5 x 2.7 x 3.9 cm. Multiple simple cysts/follicles, measuring up to 4.2 x 2.0 x 2.4 cm.  Left ovary  Not discretely visualized.  Other findings  No free fluid.  IMPRESSION: Enlarged uterus with numerous uterine fibroids measuring up to 6.3 cm, as above.  Simple right ovarian cyst/follicles measuring up to 4.2 cm, likely physiologic.   Electronically Signed  By: Julian Hy M.D.  On: 06/28/2015 14:30       Assessment    Fibroid uterus and DUB and dysmenorrhea   Normal pap and endometrial biopsy  Plan  Scheduled for TAH/BS. Risks and benefits of procedure discussed with patient including  Bleeding, transfusion, infection, injury to surrounding organs and need for additional procedures. Patient verbalized  understanding and all questions were answered.  Alvina Filbert Roselie Awkward MD 09/26/2015 2:25 PM

## 2015-09-26 NOTE — Transfer of Care (Signed)
Immediate Anesthesia Transfer of Care Note  Patient: Ellen Hinton  Procedure(s) Performed: Procedure(s): TOTAL ABDOMINAL HYSTERECTOMY WITH BILATERAL SALPINGO  (Bilateral)  Patient Location: PACU  Anesthesia Type:General  Level of Consciousness: awake, alert  and oriented  Airway & Oxygen Therapy: Patient Spontanous Breathing and Patient connected to nasal cannula oxygen  Post-op Assessment: Report given to RN and Post -op Vital signs reviewed and stable  Post vital signs: Reviewed and stable  Last Vitals:  Filed Vitals:   09/26/15 1246 09/26/15 1654  BP: 128/80   Pulse: 62   Temp: 36.9 C 36.8 C  Resp: 18     Complications: No apparent anesthesia complications

## 2015-09-26 NOTE — Anesthesia Preprocedure Evaluation (Signed)
Anesthesia Evaluation  Patient identified by MRN, date of birth, ID band Patient awake    Reviewed: Allergy & Precautions, NPO status , Patient's Chart, lab work & pertinent test results  Airway Mallampati: II  TM Distance: >3 FB Neck ROM: Full    Dental no notable dental hx.    Pulmonary neg pulmonary ROS, Current Smoker,    Pulmonary exam normal breath sounds clear to auscultation       Cardiovascular negative cardio ROS Normal cardiovascular exam Rhythm:Regular Rate:Normal     Neuro/Psych Seizures - (30 years ago. No meds. none since.),  negative neurological ROS  negative psych ROS   GI/Hepatic negative GI ROS, Neg liver ROS, neg GERD  ,  Endo/Other  negative endocrine ROS  Renal/GU negative Renal ROS  negative genitourinary   Musculoskeletal negative musculoskeletal ROS (+)   Abdominal   Peds negative pediatric ROS (+)  Hematology negative hematology ROS (+)   Anesthesia Other Findings   Reproductive/Obstetrics negative OB ROS                             Anesthesia Physical Anesthesia Plan  ASA: II  Anesthesia Plan: General   Post-op Pain Management:    Induction: Intravenous  Airway Management Planned: Oral ETT  Additional Equipment:   Intra-op Plan:   Post-operative Plan: Extubation in OR  Informed Consent: I have reviewed the patients History and Physical, chart, labs and discussed the procedure including the risks, benefits and alternatives for the proposed anesthesia with the patient or authorized representative who has indicated his/her understanding and acceptance.   Dental advisory given  Plan Discussed with: CRNA  Anesthesia Plan Comments:         Anesthesia Quick Evaluation

## 2015-09-27 ENCOUNTER — Encounter (HOSPITAL_COMMUNITY): Payer: Self-pay | Admitting: Obstetrics & Gynecology

## 2015-09-27 DIAGNOSIS — Z90710 Acquired absence of both cervix and uterus: Secondary | ICD-10-CM | POA: Diagnosis present

## 2015-09-27 LAB — CBC
HEMATOCRIT: 34.2 % — AB (ref 36.0–46.0)
HEMOGLOBIN: 11.3 g/dL — AB (ref 12.0–15.0)
MCH: 29.5 pg (ref 26.0–34.0)
MCHC: 33 g/dL (ref 30.0–36.0)
MCV: 89.3 fL (ref 78.0–100.0)
Platelets: 305 10*3/uL (ref 150–400)
RBC: 3.83 MIL/uL — AB (ref 3.87–5.11)
RDW: 18.1 % — ABNORMAL HIGH (ref 11.5–15.5)
WBC: 10.8 10*3/uL — ABNORMAL HIGH (ref 4.0–10.5)

## 2015-09-27 MED ORDER — IBUPROFEN 600 MG PO TABS
600.0000 mg | ORAL_TABLET | Freq: Four times a day (QID) | ORAL | Status: DC | PRN
  Administered 2015-09-27 – 2015-09-28 (×3): 600 mg via ORAL
  Filled 2015-09-27 (×4): qty 1

## 2015-09-27 MED ORDER — SCOPOLAMINE 1 MG/3DAYS TD PT72
MEDICATED_PATCH | TRANSDERMAL | Status: AC
  Filled 2015-09-27: qty 1

## 2015-09-27 MED ORDER — INFLUENZA VAC SPLIT QUAD 0.5 ML IM SUSY
0.5000 mL | PREFILLED_SYRINGE | INTRAMUSCULAR | Status: AC
  Administered 2015-09-28: 0.5 mL via INTRAMUSCULAR

## 2015-09-27 NOTE — Addendum Note (Signed)
Addendum  created 09/27/15 [id number] by Hewitt Blade, CRNA   Modules edited: Clinical Notes   Clinical Notes:  File: [id number]

## 2015-09-27 NOTE — Anesthesia Postprocedure Evaluation (Signed)
Anesthesia Post Note  Patient: Ellen Hinton  Procedure(s) Performed: Procedure(s) (LRB): TOTAL ABDOMINAL HYSTERECTOMY WITH BILATERAL SALPINGO  (Bilateral)  Patient location during evaluation: [facility] Anesthesia Type: General Level of consciousness: awake and alert Pain management: pain level controlled Vital Signs Assessment: post-procedure vital signs reviewed and stable Respiratory status: spontaneous breathing and patient connected to nasal cannula oxygen Cardiovascular status: stable Postop Assessment: no signs of nausea or vomiting and adequate PO intake Anesthetic complications: no    Last Vitals:  Filed Vitals:   09/27/15 0154 09/27/15 0540  BP: 115/63 116/52  Pulse: 65 61  Temp: 36.8 C 36.6 C  Resp: 15 17    Last Pain:  Filed Vitals:   09/27/15 0542  PainSc: [name]

## 2015-09-27 NOTE — Progress Notes (Signed)
1 Day Post-Op Procedure(s) (LRB): TOTAL ABDOMINAL HYSTERECTOMY WITH BILATERAL SALPINGO  (Bilateral)  Subjective: Patient reports incisional pain and + flatus.    Objective: I have reviewed patient's vital signs, intake and output and labs. Blood pressure 114/57, pulse 50, temperature 98.2 F (36.8 C), temperature source Oral, resp. rate 18, height 5' (1.524 m), weight 54.885 kg (121 lb), SpO2 100 %.  General: alert, cooperative and no distress GI: soft, non-tender; bowel sounds normal; no masses,  no organomegaly and incision: clean, dry and intact Extremities: extremities normal, atraumatic, no cyanosis or edema Vaginal Bleeding: none CBC    Component Value Date/Time   WBC 10.8* 09/27/2015 0605   RBC 3.83* 09/27/2015 0605   HGB 11.3* 09/27/2015 0605   HCT 34.2* 09/27/2015 0605   PLT 305 09/27/2015 0605   MCV 89.3 09/27/2015 0605   MCH 29.5 09/27/2015 0605   MCHC 33.0 09/27/2015 0605   RDW 18.1* 09/27/2015 0605     Intake/Output Summary (Last 24 hours) at 09/27/15 1130 Last data filed at 09/27/15 0545  Gross per 24 hour  Intake 3421.67 ml  Output   1825 ml  Net 1596.67 ml    Assessment: s/p Procedure(s): TOTAL ABDOMINAL HYSTERECTOMY WITH BILATERAL SALPINGO  (Bilateral): progressing well  Plan: Advance diet Encourage ambulation Advance to PO medication  LOS: 1 day    Giovani Neumeister 09/27/2015, 11:29 AM

## 2015-09-27 NOTE — Anesthesia Postprocedure Evaluation (Signed)
Anesthesia Post Note  Patient: Ellen Hinton  Procedure(s) Performed: Procedure(s) (LRB): TOTAL ABDOMINAL HYSTERECTOMY WITH BILATERAL SALPINGO  (Bilateral)  Patient location during evaluation: PACU Anesthesia Type: General Level of consciousness: sedated Pain management: pain level controlled Vital Signs Assessment: post-procedure vital signs reviewed and stable Respiratory status: spontaneous breathing Cardiovascular status: stable Postop Assessment: no signs of nausea or vomiting Anesthetic complications: no    Last Vitals:  Filed Vitals:   09/27/15 0154 09/27/15 0540  BP: 115/63 116/52  Pulse: 65 61  Temp: 36.8 C 36.6 C  Resp: 15 17    Last Pain:  Filed Vitals:   09/27/15 0542  PainSc: [place]

## 2015-09-28 MED ORDER — IBUPROFEN 600 MG PO TABS: 600.0000 mg | ORAL_TABLET | Freq: Four times a day (QID) | ORAL | Status: AC | PRN

## 2015-09-28 MED ORDER — OXYCODONE-ACETAMINOPHEN 5-325 MG PO TABS: 1.0000 | ORAL_TABLET | ORAL | Status: AC | PRN

## 2015-09-28 NOTE — Discharge Summary (Signed)
Physician Discharge Summary  Patient ID: Ellen Hinton MRN: [id number] DOB/AGE: 11/02/1965 50 y.o.  Admit date: 09/26/2015 Discharge date: 09/28/2015  Admission Diagnoses:DUB, Fibroid uterus   Discharge Diagnoses: same Active Problems:   S/P TAH (total abdominal hysterectomy)   Discharged Condition: good  Hospital Course:  Author: Woodroe Mode, MD Service: Obstetrics/Gynecology Author Type: Physician    Filed: 09/26/2015 2:30 PM Note Time: 09/26/2015 10:11 AM Status: Signed   Editor: Woodroe Mode, MD (Physician)     Expand All Collapse All   Chief Complaint  Patient presents with  . Pelvic Pain  DUB, fibroid uterus  HPI Ellen Hinton is a 50 y.o. female. G1P0010 Patient's last menstrual period was 05/16/2015. US shows multiple fibroids and patient has DUB and cramps, H/O myomectomy>20 years ago HPI Scheduled for hysterectomy and BS  No past medical history on file.  Past Surgical History  Procedure Laterality Date  . Myomectomy      Family History  Problem Relation Age of Onset  . Cancer Mother     lung  . Hypertension Father   . Stroke Father   . Diabetes Maternal Grandmother     Social History Social History  Substance Use Topics  . Smoking status: Current Every Day Smoker    Types: E-cigarettes  . Smokeless tobacco: Never Used  . Alcohol Use: No    Allergies  Allergen Reactions  . Penicillins     Current Outpatient Prescriptions  Medication Sig Dispense Refill  . medroxyPROGESTERone (PROVERA) 10 MG tablet Take 2 tablets (20 mg total) by mouth daily. 30 tablet 2   Current Facility-Administered Medications  Medication Dose Route Frequency Provider Last Rate Last Dose  . ferrous sulfate tablet 325 mg 325 mg Oral BID WC Woodroe Mode, MD    . leuprolide (LUPRON)  injection 11.25 mg 11.25 mg Intramuscular Once Woodroe Mode, MD      Review of Systems Review of Systems  Constitutional: Positive for fatigue.  Gastrointestinal: Positive for abdominal pain.  Genitourinary: Positive for vaginal bleeding, menstrual problem and pelvic pain.    Blood pressure 128/80, pulse 62, temperature 98.4 F (36.9 C), temperature source Oral, resp. rate 18, SpO2 100 %.   Physical Exam Physical Exam  Constitutional: She appears well-developed. No distress.  Chest: normal effort no respiratory distress Abdomen: soft with firm mass 14-16 weeks size c/w fibroid uterus Skin: Skin is warm and dry. No pallor.  Psychiatric: She has a normal mood and affect. Her behavior is normal.    Data Reviewed   CLINICAL DATA: Chronic pelvic pain, irregular cycles, fibroids  EXAM: TRANSABDOMINAL AND TRANSVAGINAL ULTRASOUND OF PELVIS  TECHNIQUE: Both transabdominal and transvaginal ultrasound examinations of the pelvis were performed. Transabdominal technique was performed for global imaging of the pelvis including uterus, ovaries, adnexal regions, and pelvic cul-de-sac. It was necessary to proceed with endovaginal exam following the transabdominal exam to visualize the endometrium.  COMPARISON: None  FINDINGS: Uterus  Measurements: 14.3 x 7.7 x 9.9 cm. Numerous uterine fibroids, including:  --4.5 x 4.2 x 4.0 cm subserosal fibroid in the left uterine fundus  --5.0 x 4.0 x 4.6 cm degenerating subserosal/pedunculated fibroid fibroid in the left uterine fundus  --5.9 x 5.5 x 5.7 cm subserosal fibroid in the left uterine body  --4.8 x 5.2 x 6.3 cm subserosal fibroid in the right uterine fundus  Endometrium  Thickness: 5 mm. Poorly visualized/ distorted by multiple uterine fibroids.  Right ovary  Measurements: 6.5 x 2.7 x 3.9 cm. Multiple  simple cysts/follicles, measuring up to 4.2 x 2.0 x 2.4 cm.  Left  ovary  Not discretely visualized.  Other findings  No free fluid.  IMPRESSION: Enlarged uterus with numerous uterine fibroids measuring up to 6.3 cm, as above.  Simple right ovarian cyst/follicles measuring up to 4.2 cm, likely physiologic.   Electronically Signed  By: Julian Hy M.D.  On: 06/28/2015 14:30                Consults: None  Significant Diagnostic Studies: labs:  CBC    Component Value Date/Time   WBC 10.8* 09/27/2015 0605   RBC 3.83* 09/27/2015 0605   HGB 11.3* 09/27/2015 0605   HCT 34.2* 09/27/2015 0605   PLT 305 09/27/2015 0605   MCV 89.3 09/27/2015 0605   MCH 29.5 09/27/2015 0605   MCHC 33.0 09/27/2015 0605   RDW 18.1* 09/27/2015 0605      Treatments: surgery: TAH/BS  Discharge Exam: Blood pressure 112/59, pulse 50, temperature 98 F (36.7 C), temperature source Axillary, resp. rate 16, height 5' (1.524 m), weight 121 lb (54.885 kg), SpO2 99 %. General appearance: alert, cooperative and no distress GI: soft, non-tender; bowel sounds normal; no masses,  no organomegaly and incision dry and dressing intact Extremities: extremities normal, atraumatic, no cyanosis or edema  Disposition: Final discharge disposition not confirmed Discharge home    Medication List    STOP taking these medications        ferrous sulfate 325 (65 FE) MG tablet     medroxyPROGESTERone 10 MG tablet  Commonly known as:  PROVERA      TAKE these medications        ibuprofen 600 MG tablet  Commonly known as:  ADVIL,MOTRIN  Take 1 tablet (600 mg total) by mouth every 6 (six) hours as needed for mild pain or moderate pain.     oxyCODONE-acetaminophen 5-325 MG tablet  Commonly known as:  PERCOCET/ROXICET  Take 1-2 tablets by mouth every 4 (four) hours as needed for severe pain (moderate to severe pain (when tolerating fluids)).           Follow-up Information    Follow up with [facility] In 4 weeks.   Specialty:   Obstetrics and Gynecology   Contact information:   [address] [place] [phone]      Signed: Emeterio Reeve 09/28/2015, 10:25 AM

## 2015-09-28 NOTE — Discharge Instructions (Signed)
Abdominal Hysterectomy, Care After °Refer to this sheet in the next few weeks. These instructions provide you with information on caring for yourself after your procedure. Your health care provider may also give you more specific instructions. Your treatment has been planned according to current medical practices, but problems sometimes occur. Call your health care provider if you have any problems or questions after your procedure.  °WHAT TO EXPECT AFTER THE PROCEDURE °After your procedure, it is typical to have the following: °· Pain. °· Feeling tired. °· Poor appetite. °· Less interest in sex. °It takes 4-6 weeks to recover from this surgery.  °HOME CARE INSTRUCTIONS  °· Take pain medicines only as directed by your health care provider. Do not take over-the-counter pain medicines without checking with your health care provider first.  °· Change your bandage as directed by your health care provider. °· Return to your health care provider to have your sutures taken out. °· Take showers instead of baths for 2-3 weeks. Ask your health care provider when it is safe to start showering.  °· Do not douche, use tampons, or have sexual intercourse for at least 6 weeks or until your health care provider says you can.   °· Follow your health care provider's advice about exercise, lifting, driving, and general activities. °· Get plenty of rest and sleep.   °· Do not lift anything heavier than a gallon of milk (about 10 lb [4.5 kg]) for the first month after surgery. °· You can resume your normal diet if your health care provider says it is okay.   °· Do not drink alcohol until your health care provider says you can.   °· If you are constipated, ask your health care provider if you can take a mild laxative. °· Eating foods high in fiber may also help with constipation. Eat plenty of raw fruits and vegetables, whole grains, and beans. °· Drink enough fluids to keep your urine clear or pale yellow.   °· Try to have someone at  home with you for the first 1-2 weeks to help around the house. °· Keep all follow-up appointments. °SEEK MEDICAL CARE IF:  °· You have chills or fever. °· You have swelling, redness, or pain in the area of your incision that is getting worse.   °· You have pus coming from the incision.   °· You notice a bad smell coming from the incision or bandage.   °· Your incision breaks open.   °· You feel dizzy or light-headed.   °· You have pain or bleeding when you urinate.   °· You have persistent diarrhea.   °· You have persistent nausea and vomiting.   °· You have abnormal vaginal discharge.   °· You have a rash.   °· You have any type of abnormal reaction or develop an allergy to your medicine.   °· Your pain medicine is not helping.   °SEEK IMMEDIATE MEDICAL CARE IF:  °· You have a fever and your symptoms suddenly get worse. °· You have severe abdominal pain. °· You have chest pain. °· You have shortness of breath. °· You faint. °· You have pain, swelling, or redness of your leg. °· You have heavy vaginal bleeding with blood clots. °MAKE SURE YOU: °· Understand these instructions. °· Will watch your condition. °· Will get help right away if you are not doing well or get worse. °  °This information is not intended to replace advice given to you by your health care provider. Make sure you discuss any questions you have with your health care provider. °  °Document   Released: 03/15/2005 Document Revised: 09/16/2014 Document Reviewed: 06/18/2013 °Elsevier Interactive Patient Education ©2016 [employer] ° °

## 2015-09-28 NOTE — Progress Notes (Signed)
Pt is discharged in care of friend,with N.T. Escort. Denies any pain or discomfort. Spirits are good No equipment needed for home use. Stable. Abdominal dressing is clean and dry. Understands all discharged instructions well Questions asked answered..Stable.

## 2015-10-23 ENCOUNTER — Encounter: Payer: Self-pay | Admitting: Obstetrics & Gynecology

## 2015-10-23 ENCOUNTER — Ambulatory Visit (INDEPENDENT_AMBULATORY_CARE_PROVIDER_SITE_OTHER): Payer: Self-pay | Admitting: Obstetrics & Gynecology

## 2015-10-23 VITALS — BP 149/83 | HR 74 | Temp 97.9°F | Ht 60.0 in | Wt 119.8 lb

## 2015-10-23 DIAGNOSIS — N898 Other specified noninflammatory disorders of vagina: Secondary | ICD-10-CM

## 2015-10-23 DIAGNOSIS — Z9889 Other specified postprocedural states: Secondary | ICD-10-CM

## 2015-10-23 NOTE — Progress Notes (Signed)
Subjective:vaginal discharge with odor     Ellen Hinton is a 50 y.o. female who presents to the clinic 4 weeks status post total abdominal hysterectomy for fibroids. Eating a regular diet without difficulty. Bowel movements are normal. Pain is controlled without any medications.  The following portions of the patient's history were reviewed and updated as appropriate: allergies, current medications, past family history, past medical history, past social history, past surgical history and problem list.  Review of Systems Genitourinary:positive for vaginal discharge with odor and no itching    Objective:    BP 149/83 mmHg  Pulse 74  Temp(Src) 97.9 F (36.6 C) (Oral)  Ht 5' (1.524 m)  Wt 119 lb 12.8 oz (54.341 kg)  BMI 23.40 kg/m2  LMP 05/16/2015 General:  alert, cooperative and no distress  Abdomen: soft, bowel sounds active, non-tender  Incision:   healing well, no drainage, no erythema, no hernia, no seroma, no swelling, no dehiscence, incision well approximated    White thick vaginal discharge and cuff intact no tenderness no mass, wet prep sent Assessment:    Doing well postoperatively. Operative findings again reviewed. Pathology report discussed.    Plan:    1. Continue any current medications. 2. Wound care discussed. 3. Activity restrictions: pelvic rest for 2 more weeks 4. Anticipated return to work: 1-2 weeks. 5. Follow up as needed, will notify her of wet prep result  Woodroe Mode, MD 10/23/2015

## 2015-10-23 NOTE — Patient Instructions (Signed)
Abdominal Hysterectomy, Care After °Refer to this sheet in the next few weeks. These instructions provide you with information on caring for yourself after your procedure. Your health care provider may also give you more specific instructions. Your treatment has been planned according to current medical practices, but problems sometimes occur. Call your health care provider if you have any problems or questions after your procedure.  °WHAT TO EXPECT AFTER THE PROCEDURE °After your procedure, it is typical to have the following: °· Pain. °· Feeling tired. °· Poor appetite. °· Less interest in sex. °It takes 4-6 weeks to recover from this surgery.  °HOME CARE INSTRUCTIONS  °· Take pain medicines only as directed by your health care provider. Do not take over-the-counter pain medicines without checking with your health care provider first.  °· Change your bandage as directed by your health care provider. °· Return to your health care provider to have your sutures taken out. °· Take showers instead of baths for 2-3 weeks. Ask your health care provider when it is safe to start showering.  °· Do not douche, use tampons, or have sexual intercourse for at least 6 weeks or until your health care provider says you can.   °· Follow your health care provider's advice about exercise, lifting, driving, and general activities. °· Get plenty of rest and sleep.   °· Do not lift anything heavier than a gallon of milk (about 10 lb [4.5 kg]) for the first month after surgery. °· You can resume your normal diet if your health care provider says it is okay.   °· Do not drink alcohol until your health care provider says you can.   °· If you are constipated, ask your health care provider if you can take a mild laxative. °· Eating foods high in fiber may also help with constipation. Eat plenty of raw fruits and vegetables, whole grains, and beans. °· Drink enough fluids to keep your urine clear or pale yellow.   °· Try to have someone at  home with you for the first 1-2 weeks to help around the house. °· Keep all follow-up appointments. °SEEK MEDICAL CARE IF:  °· You have chills or fever. °· You have swelling, redness, or pain in the area of your incision that is getting worse.   °· You have pus coming from the incision.   °· You notice a bad smell coming from the incision or bandage.   °· Your incision breaks open.   °· You feel dizzy or light-headed.   °· You have pain or bleeding when you urinate.   °· You have persistent diarrhea.   °· You have persistent nausea and vomiting.   °· You have abnormal vaginal discharge.   °· You have a rash.   °· You have any type of abnormal reaction or develop an allergy to your medicine.   °· Your pain medicine is not helping.   °SEEK IMMEDIATE MEDICAL CARE IF:  °· You have a fever and your symptoms suddenly get worse. °· You have severe abdominal pain. °· You have chest pain. °· You have shortness of breath. °· You faint. °· You have pain, swelling, or redness of your leg. °· You have heavy vaginal bleeding with blood clots. °MAKE SURE YOU: °· Understand these instructions. °· Will watch your condition. °· Will get help right away if you are not doing well or get worse. °  °This information is not intended to replace advice given to you by your health care provider. Make sure you discuss any questions you have with your health care provider. °  °Document   Released: 03/15/2005 Document Revised: 09/16/2014 Document Reviewed: 06/18/2013 °Elsevier Interactive Patient Education ©2016 [employer] ° °

## 2015-10-24 LAB — WET PREP, GENITAL
Trich, Wet Prep: NONE SEEN
WBC WET PREP: NONE SEEN
Yeast Wet Prep HPF POC: NONE SEEN

## 2015-10-26 ENCOUNTER — Other Ambulatory Visit: Payer: Self-pay | Admitting: General Practice

## 2015-10-26 DIAGNOSIS — N760 Acute vaginitis: Principal | ICD-10-CM

## 2015-10-26 DIAGNOSIS — B9689 Other specified bacterial agents as the cause of diseases classified elsewhere: Secondary | ICD-10-CM

## 2015-10-26 MED ORDER — METRONIDAZOLE 500 MG PO TABS: 500.0000 mg | ORAL_TABLET | Freq: Two times a day (BID) | ORAL | Status: AC

## 2015-10-26 NOTE — Telephone Encounter (Signed)
Patient called and left message requesting her results from her follow up appt with Dr Roselie Awkward. Per chart review patient has BV. Med sent to pharmacy. Called patient, no answer- left message stating we are trying to reach you to return your phone call and to inform you of med sent to pharmacy.

## 2015-10-27 NOTE — Telephone Encounter (Signed)
Pt informed of results.

## 2015-12-25 ENCOUNTER — Other Ambulatory Visit (HOSPITAL_COMMUNITY): Payer: Self-pay | Admitting: *Deleted

## 2015-12-25 DIAGNOSIS — N644 Mastodynia: Secondary | ICD-10-CM

## 2015-12-25 DIAGNOSIS — N6320 Unspecified lump in the left breast, unspecified quadrant: Secondary | ICD-10-CM

## 2015-12-28 ENCOUNTER — Ambulatory Visit
Admission: RE | Admit: 2015-12-28 | Discharge: 2015-12-28 | Disposition: A | Discharge status: Home / Self Care | Payer: No Typology Code available for payment source | Source: Ambulatory Visit | Attending: Obstetrics and Gynecology | Admitting: Obstetrics and Gynecology

## 2015-12-28 ENCOUNTER — Other Ambulatory Visit (HOSPITAL_COMMUNITY): Payer: Self-pay | Admitting: Obstetrics and Gynecology

## 2015-12-28 ENCOUNTER — Encounter (HOSPITAL_COMMUNITY): Payer: Self-pay

## 2015-12-28 ENCOUNTER — Ambulatory Visit (HOSPITAL_COMMUNITY)
Admission: RE | Admit: 2015-12-28 | Discharge: 2015-12-28 | Disposition: A | Discharge status: Home / Self Care | Payer: Self-pay | Source: Ambulatory Visit | Attending: Obstetrics and Gynecology | Admitting: Obstetrics and Gynecology

## 2015-12-28 VITALS — BP 114/70 | Temp 97.9°F | Ht 60.0 in | Wt 116.0 lb

## 2015-12-28 DIAGNOSIS — N644 Mastodynia: Secondary | ICD-10-CM

## 2015-12-28 DIAGNOSIS — N6320 Unspecified lump in the left breast, unspecified quadrant: Secondary | ICD-10-CM

## 2015-12-28 DIAGNOSIS — N6321 Unspecified lump in the left breast, upper outer quadrant: Secondary | ICD-10-CM

## 2015-12-28 DIAGNOSIS — Z1239 Encounter for other screening for malignant neoplasm of breast: Secondary | ICD-10-CM

## 2015-12-28 NOTE — Progress Notes (Signed)
Patient is a current [facility] patient that is complaining of a new left breast lump x one week that is painful at times. Patient rates the pain at a 7 out of 10 and states it is worse when she touches it.  Physical exam: Breasts Breasts symmetrical. No skin abnormalities bilateral breasts. No nipple retraction bilateral breasts. No nipple discharge bilateral breasts. No lymphadenopathy. No lumps palpated right breast. Palpated a mobile lump within the left breast at 1 o'clock 1 cm from the nipple. Complaints of pain when palpated lump. Referred patient to the [facility] for a left breast diagnostic mammogram and possible ultrasound. Appointment scheduled for Thursday, December 28, 2015 at 1310.        Smoking History: Patient has never smoked.  Patient Navigation: Patient education provided. Access to services provided for patient through [facility] program.

## 2015-12-28 NOTE — Patient Instructions (Addendum)
Patient is a current [facility] patient. Referred Salia Abundiz to the [facility] for a left breast diagnostic mammogram and possible ultrasound. Appointment scheduled for Thursday, December 28, 2015 at 1310. Maclyn Comes verbalized understanding.  Delio Slates, Arvil Chaco, RN 2:27 PM

## 2017-02-05 IMAGING — US US BREAST*L* LIMITED INC AXILLA
1 series · 7 of 7 positions shown · non-contrast
Comparison: Previous exam(s).

CLINICAL DATA: Left breast lump and pain for 1 week.

EXAM:
2D DIGITAL DIAGNOSTIC LEFT MAMMOGRAM WITH CAD AND ADJUNCT TOMO
ULTRASOUND LEFT BREAST

[Series 1: us breast*left* limited inc axilla · 0.07mm/px · 7 of 7 slices shown]
[im 1/7]
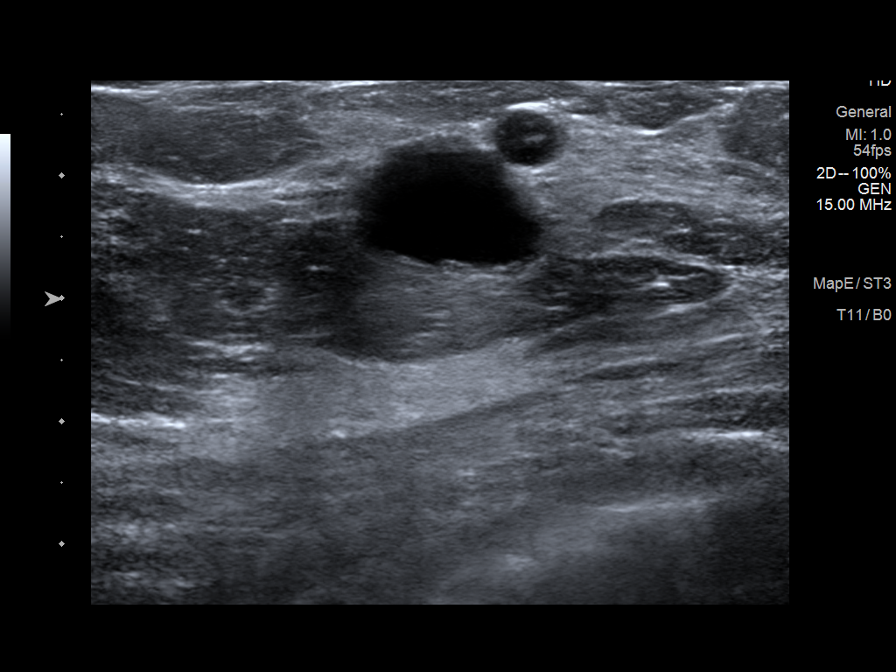
[im 2/7]
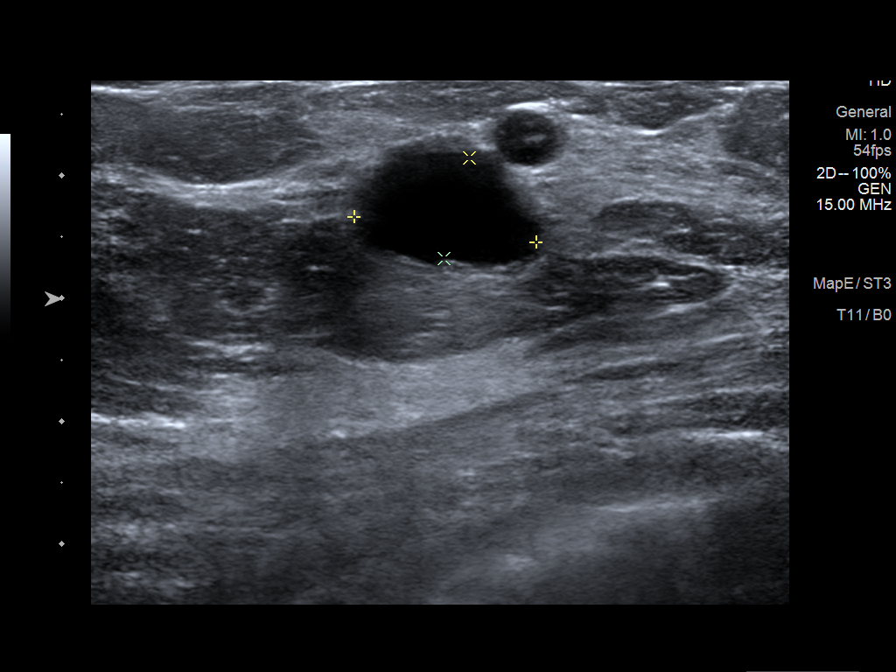
[im 3/7]
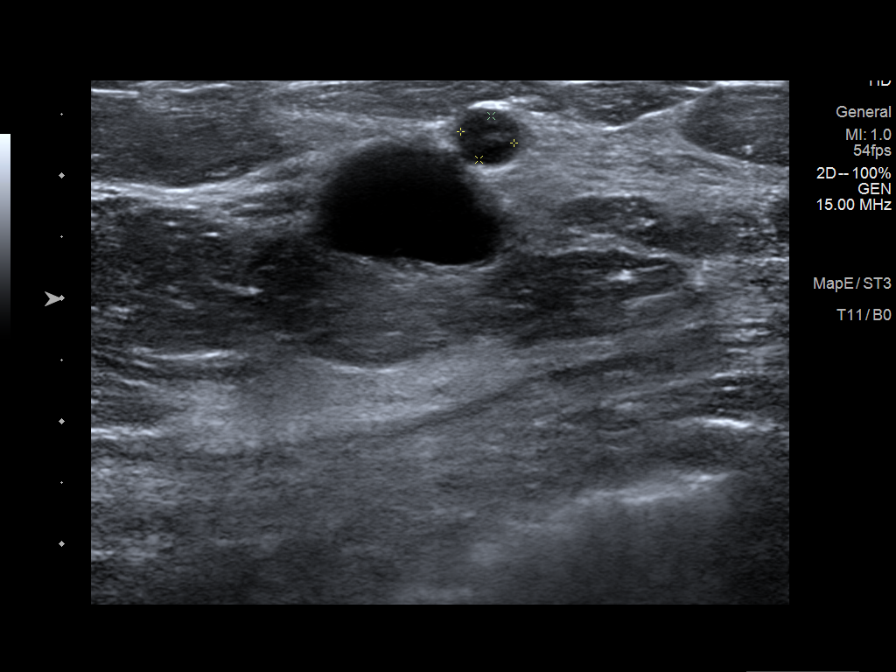
[im 4/7]
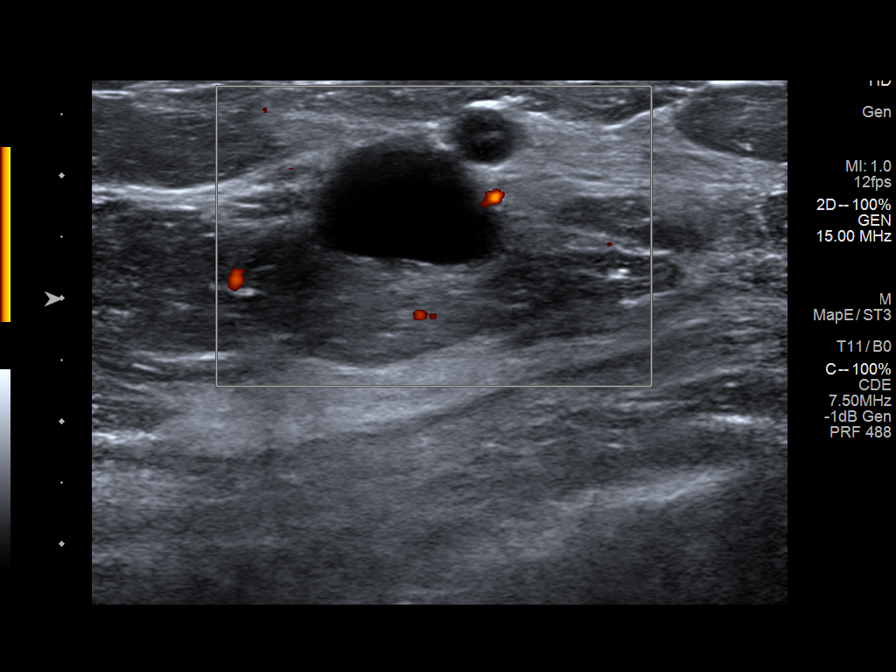
[im 5/7]
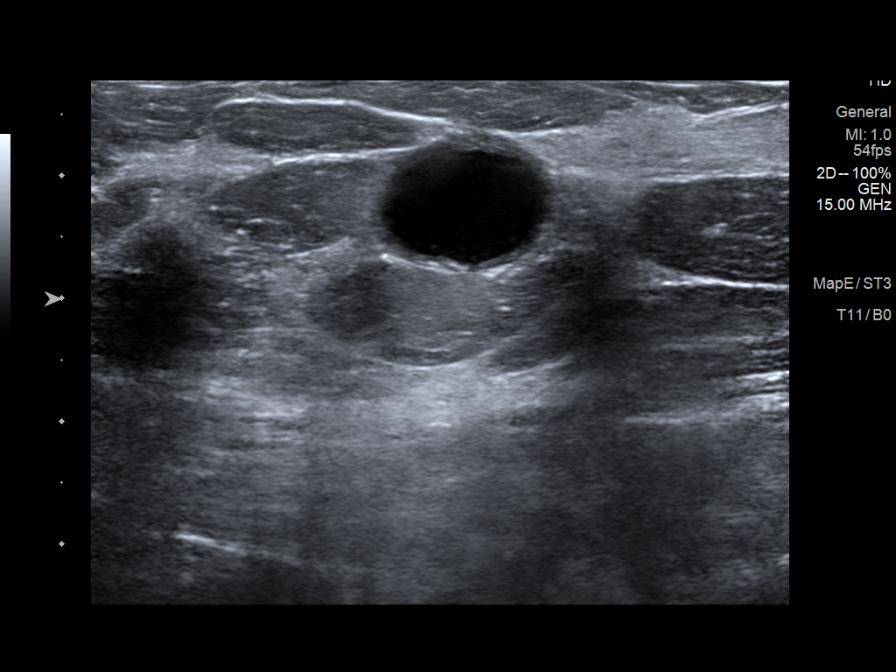
[im 6/7]
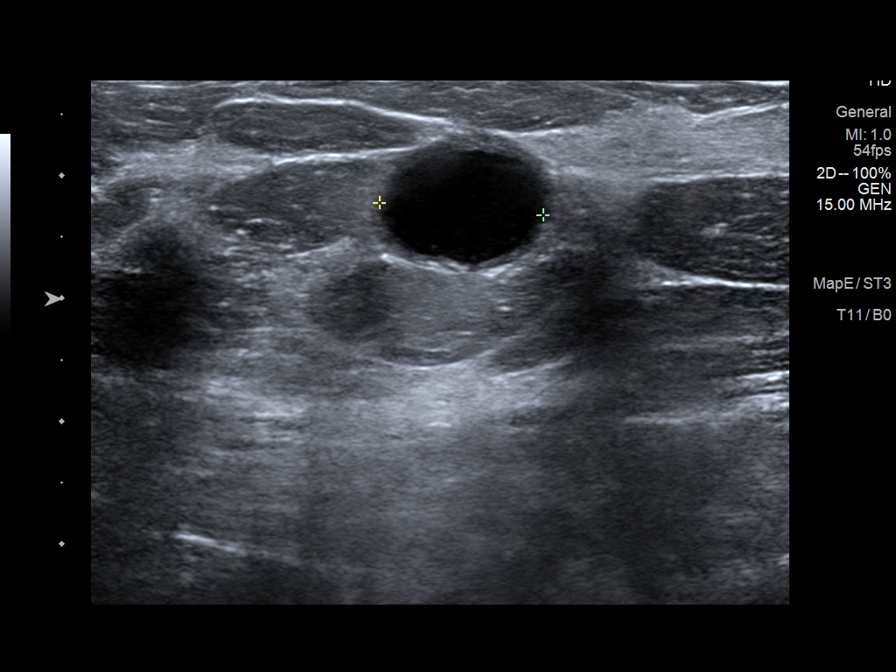
[im 7/7]
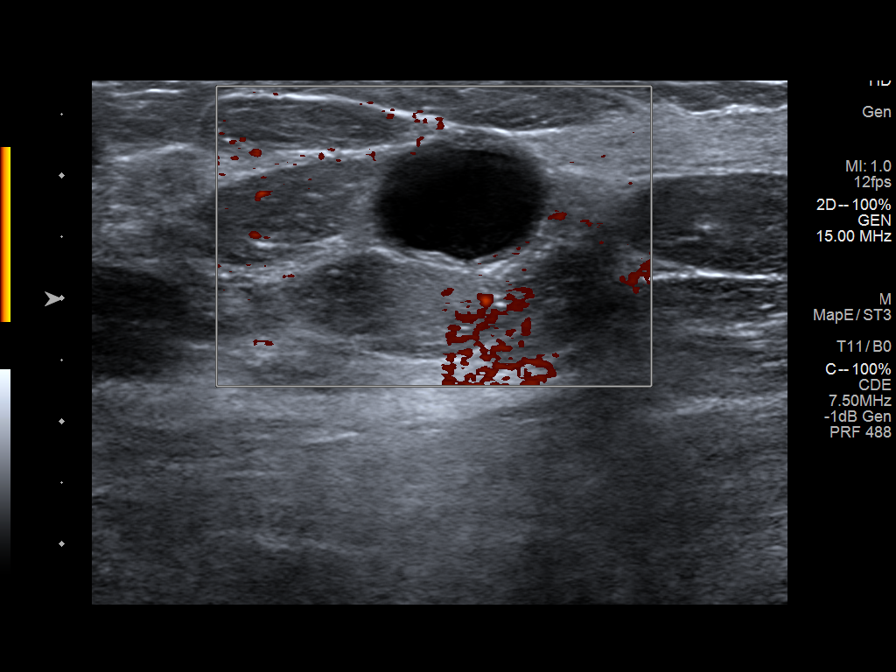

[7 of 7 positions shown; findings below may reference images not displayed]

ACR Breast Density Category b: There are scattered areas of
fibroglandular density.
FINDINGS: Again noted are 2 oval circumscribed masses within the upper-outer
quadrant of the left breast, not significantly changed in size or
extent compared to previous exams. There are no new dominant masses,
suspicious calcifications or secondary signs of malignancy within
the left breast.

Mammographic images were processed with CAD.

On physical exam, there is a palpable last within the upper-outer
quadrant of the left breast. There is associated pain at the site,
per the patient.

Targeted ultrasound is performed, again showing a benign cyst within
the left breast at the 2 o'clock axis, 2 cm from nipple, slightly
smaller in overall size with a current measurement of 1.5 x 0.8 x
1.3 cm. Several additional smaller cysts, some simple and some
complicated, are seen within the upper-outer quadrant.

There are no suspicious solid or cystic masses identified within the
area of clinical concern.
IMPRESSION: Benign cysts within the upper-outer quadrant of the left breast,
largest of which corresponds to the site of patient's palpable lump
and pain.

RECOMMENDATION:
Annual bilateral screening mammograms. Next bilateral screening
mammogram is due in Tuesday May, 2016 in conjunction with patient's
routine right breast screening mammogram schedule.

As the dominant left breast cyst is causing pain, a therapeutic
aspiration of this cyst will be performed later today for pain
relief.

I have discussed the findings and recommendations with the patient.
Results were also provided in writing at the conclusion of the
visit. If applicable, a reminder letter will be sent to the patient
regarding the next appointment.

BI-RADS CATEGORY  2: Benign.

## 2017-02-05 IMAGING — MG MM DIGITAL DIAGNOSTIC UNILAT*L* W/ TOMO W/ CAD
7 series · 8 of 15 positions shown · non-contrast
Comparison: Previous exam(s).

CLINICAL DATA: Left breast lump and pain for 1 week.

EXAM:
2D DIGITAL DIAGNOSTIC LEFT MAMMOGRAM WITH CAD AND ADJUNCT TOMO
ULTRASOUND LEFT BREAST

[L TAN]
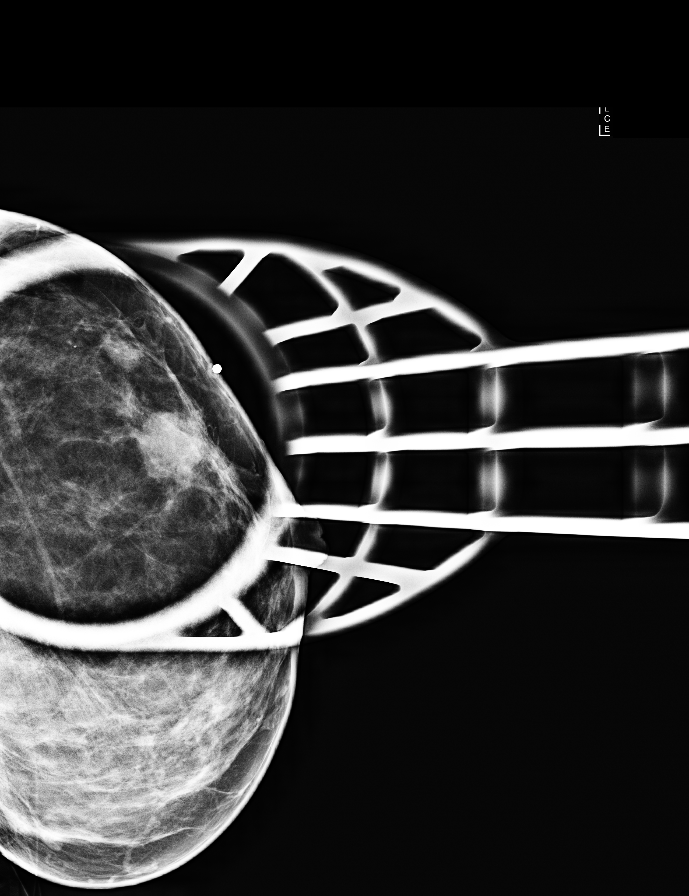

[L MLO]
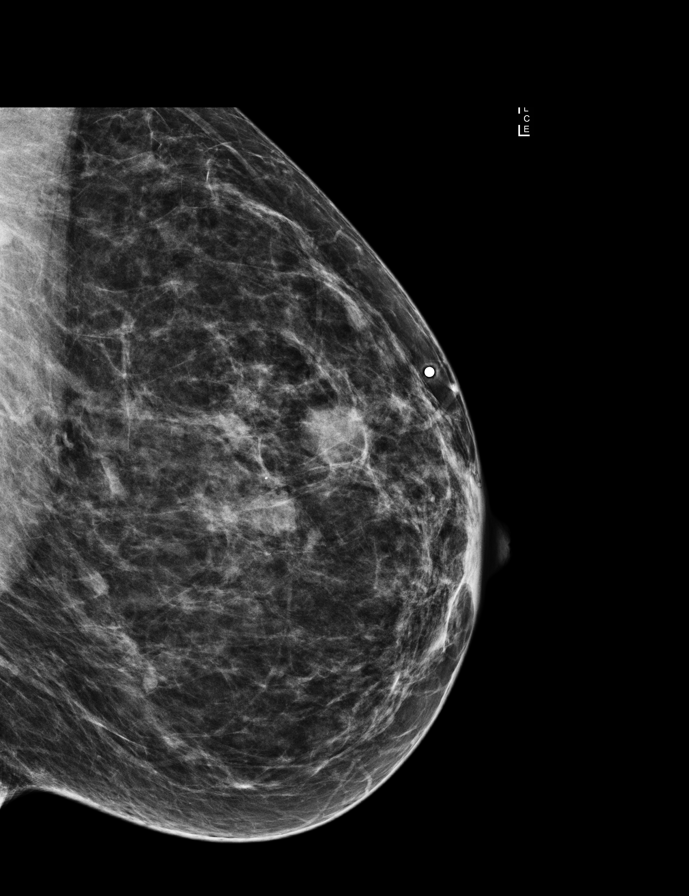

[L CC synth-2D]
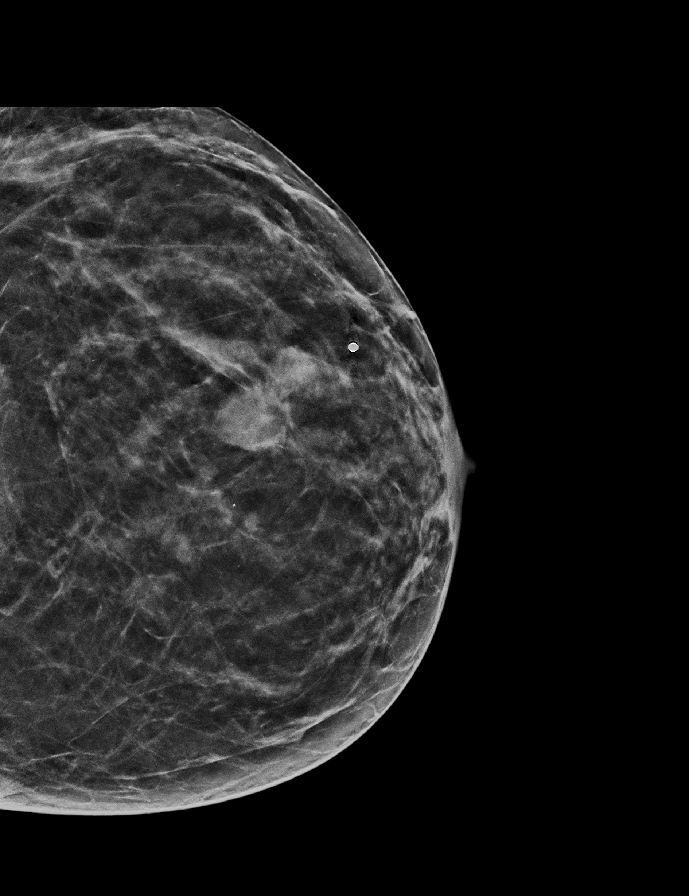

[L MLO synth-2D]
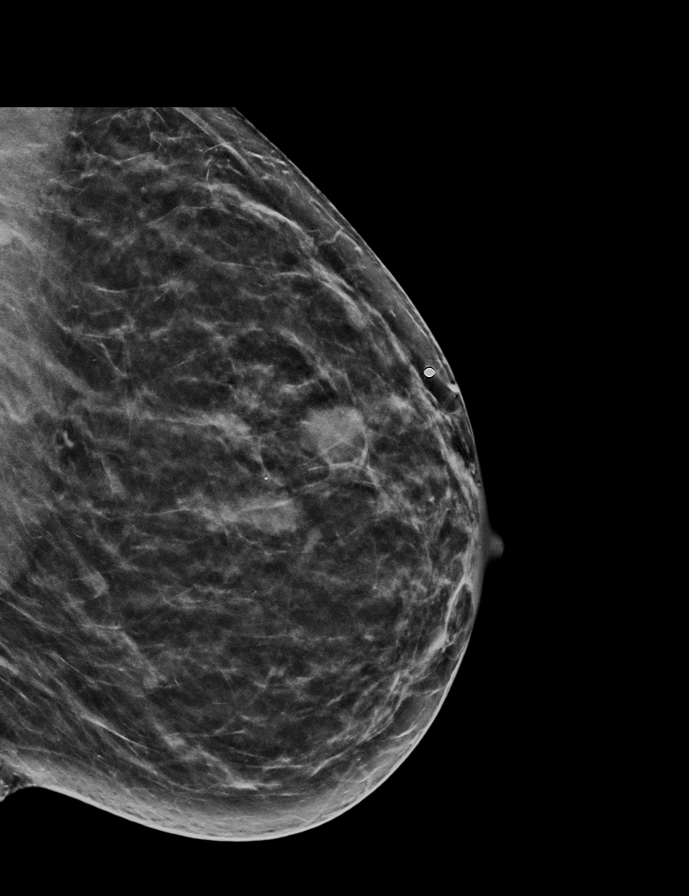

[L CC]
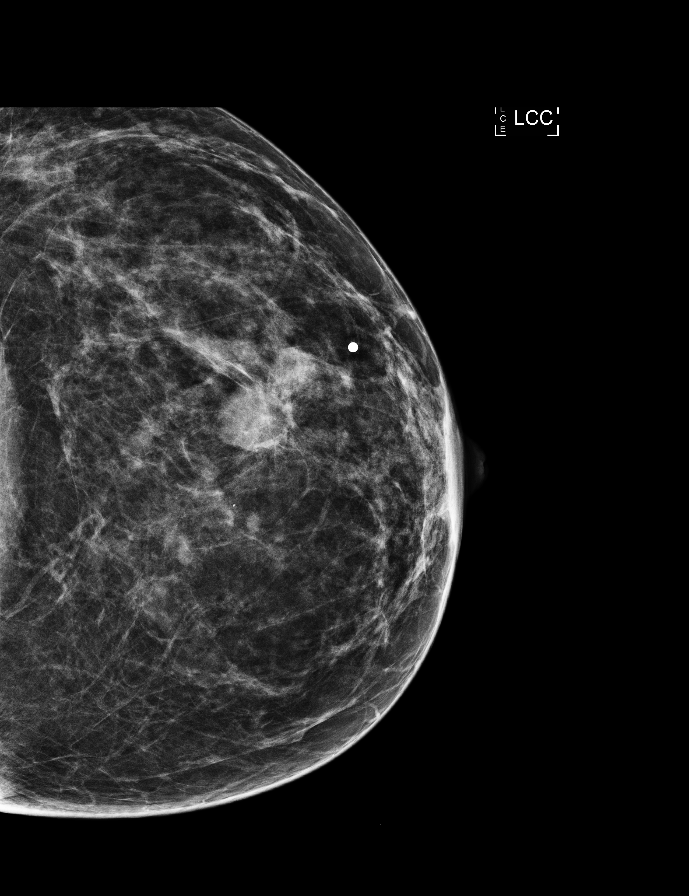

[L MLO tomo · 2 of 54 frames shown]
[frame 18/54]
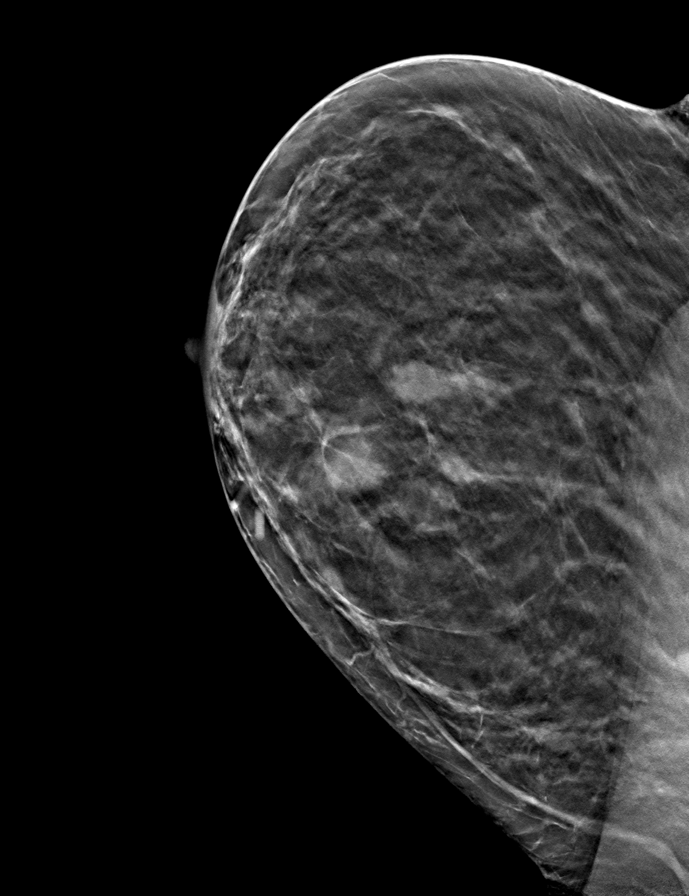
[frame 27/54]
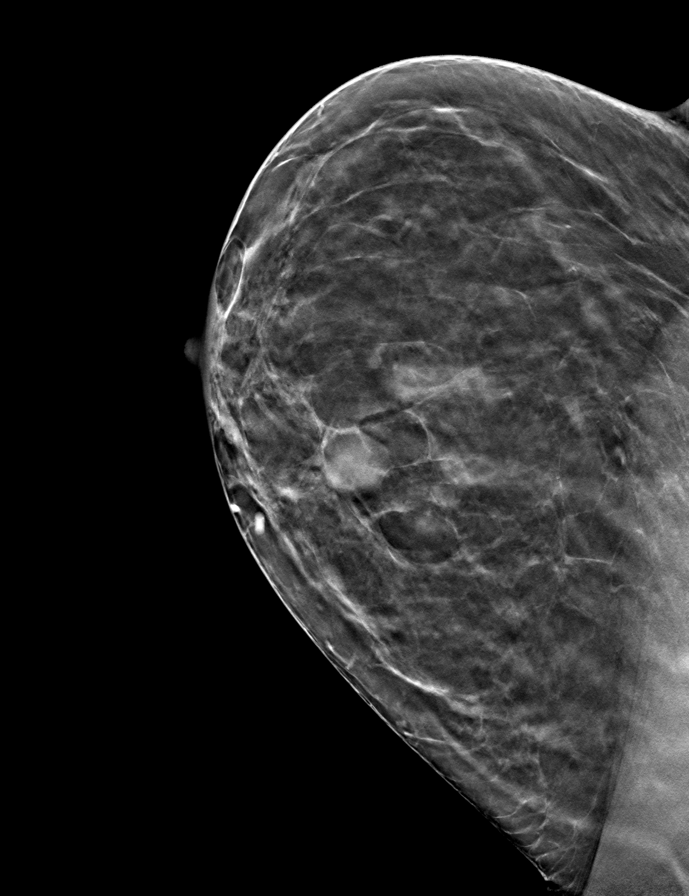

[L CC tomo · tomo slice 27/52.0]
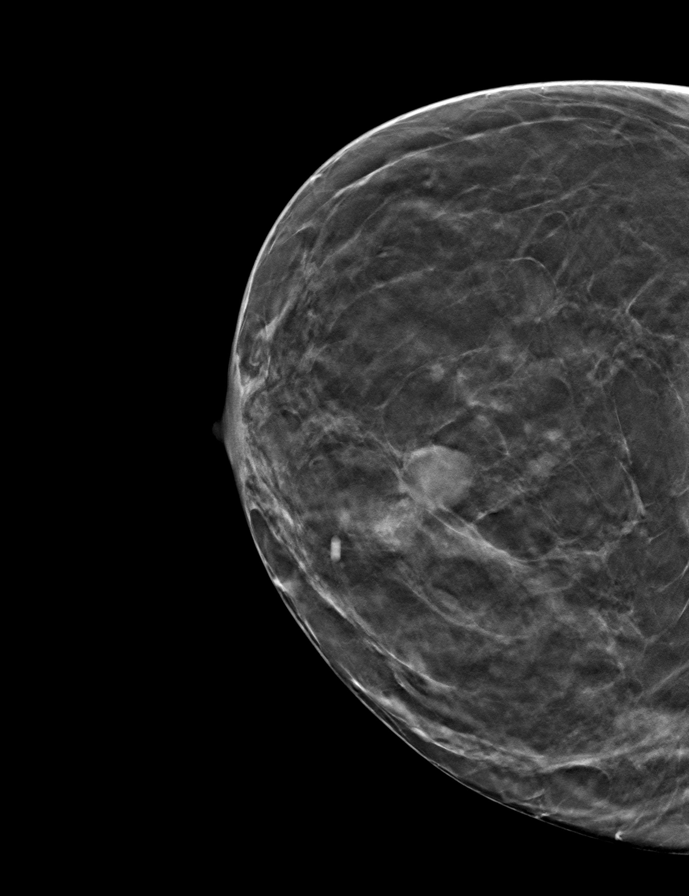

[8 of 15 positions shown; findings below may reference images not displayed]

ACR Breast Density Category b: There are scattered areas of
fibroglandular density.
FINDINGS: Again noted are 2 oval circumscribed masses within the upper-outer
quadrant of the left breast, not significantly changed in size or
extent compared to previous exams. There are no new dominant masses,
suspicious calcifications or secondary signs of malignancy within
the left breast.

Mammographic images were processed with CAD.

On physical exam, there is a palpable last within the upper-outer
quadrant of the left breast. There is associated pain at the site,
per the patient.

Targeted ultrasound is performed, again showing a benign cyst within
the left breast at the 2 o'clock axis, 2 cm from nipple, slightly
smaller in overall size with a current measurement of 1.5 x 0.8 x
1.3 cm. Several additional smaller cysts, some simple and some
complicated, are seen within the upper-outer quadrant.

There are no suspicious solid or cystic masses identified within the
area of clinical concern.
IMPRESSION: Benign cysts within the upper-outer quadrant of the left breast,
largest of which corresponds to the site of patient's palpable lump
and pain.

RECOMMENDATION:
Annual bilateral screening mammograms. Next bilateral screening
mammogram is due in Tuesday May, 2016 in conjunction with patient's
routine right breast screening mammogram schedule.

As the dominant left breast cyst is causing pain, a therapeutic
aspiration of this cyst will be performed later today for pain
relief.

I have discussed the findings and recommendations with the patient.
Results were also provided in writing at the conclusion of the
visit. If applicable, a reminder letter will be sent to the patient
regarding the next appointment.

BI-RADS CATEGORY  2: Benign.
